# Patient Record
Sex: Female | Born: 1955 | Race: Black or African American | Hispanic: No | Smoking: Never smoker
Health system: Southern US, Community
[De-identification: ages and names within clinical notes are randomized; demographics above are authoritative.]

## PROBLEM LIST (undated history)

## (undated) ENCOUNTER — Ambulatory Visit: Admission: EM | Payer: Medicare HMO

## (undated) DIAGNOSIS — K760 Fatty (change of) liver, not elsewhere classified: Secondary | ICD-10-CM

## (undated) DIAGNOSIS — Z9889 Other specified postprocedural states: Secondary | ICD-10-CM

## (undated) DIAGNOSIS — I10 Essential (primary) hypertension: Secondary | ICD-10-CM

## (undated) DIAGNOSIS — K219 Gastro-esophageal reflux disease without esophagitis: Secondary | ICD-10-CM

## (undated) DIAGNOSIS — R7303 Prediabetes: Secondary | ICD-10-CM

## (undated) DIAGNOSIS — J189 Pneumonia, unspecified organism: Secondary | ICD-10-CM

## (undated) DIAGNOSIS — E785 Hyperlipidemia, unspecified: Secondary | ICD-10-CM

## (undated) HISTORY — DX: Prediabetes: R73.03

## (undated) HISTORY — PX: APPENDECTOMY: SHX54

## (undated) HISTORY — DX: Gastro-esophageal reflux disease without esophagitis: K21.9

## (undated) HISTORY — DX: Fatty (change of) liver, not elsewhere classified: K76.0

## (undated) HISTORY — PX: ABDOMINAL HYSTERECTOMY: SHX81

## (undated) HISTORY — PX: CHOLECYSTECTOMY: SHX55

## (undated) HISTORY — DX: Other specified postprocedural states: Z98.890

## (undated) HISTORY — PX: TEMPORAL ARTERY BIOPSY / LIGATION: SUR132

## (undated) HISTORY — PX: OTHER SURGICAL HISTORY: SHX169

---

## 1999-05-30 DIAGNOSIS — Z9889 Other specified postprocedural states: Secondary | ICD-10-CM

## 1999-05-30 HISTORY — DX: Other specified postprocedural states: Z98.890

## 2005-03-25 ENCOUNTER — Emergency Department (HOSPITAL_COMMUNITY): Admission: EM | Admit: 2005-03-25 | Discharge: 2005-03-25 | Payer: Self-pay | Admitting: Emergency Medicine

## 2006-05-29 DIAGNOSIS — Z9889 Other specified postprocedural states: Secondary | ICD-10-CM

## 2006-05-29 HISTORY — DX: Other specified postprocedural states: Z98.890

## 2007-08-02 ENCOUNTER — Observation Stay (HOSPITAL_COMMUNITY): Admission: EM | Admit: 2007-08-02 | Discharge: 2007-08-03 | Payer: Self-pay | Admitting: Family Medicine

## 2007-08-08 ENCOUNTER — Emergency Department (HOSPITAL_COMMUNITY): Admission: EM | Admit: 2007-08-08 | Discharge: 2007-08-08 | Payer: Self-pay | Admitting: Emergency Medicine

## 2009-07-19 ENCOUNTER — Emergency Department: Payer: Self-pay | Admitting: Emergency Medicine

## 2010-06-19 ENCOUNTER — Encounter: Payer: Self-pay | Admitting: Internal Medicine

## 2010-06-22 ENCOUNTER — Ambulatory Visit (HOSPITAL_COMMUNITY)
Admission: RE | Admit: 2010-06-22 | Discharge: 2010-06-22 | Payer: Self-pay | Source: Home / Self Care | Attending: Family Medicine | Admitting: Family Medicine

## 2010-06-22 ENCOUNTER — Other Ambulatory Visit (HOSPITAL_COMMUNITY): Payer: Self-pay | Admitting: Family Medicine

## 2010-06-22 DIAGNOSIS — Z1239 Encounter for other screening for malignant neoplasm of breast: Secondary | ICD-10-CM

## 2010-06-23 ENCOUNTER — Ambulatory Visit (HOSPITAL_COMMUNITY)
Admission: RE | Admit: 2010-06-23 | Discharge: 2010-06-23 | Payer: Self-pay | Source: Home / Self Care | Attending: Family Medicine | Admitting: Family Medicine

## 2010-07-11 ENCOUNTER — Ambulatory Visit: Payer: Self-pay | Admitting: Gastroenterology

## 2010-07-18 ENCOUNTER — Other Ambulatory Visit (HOSPITAL_COMMUNITY): Payer: Self-pay | Admitting: Family Medicine

## 2010-07-18 DIAGNOSIS — R928 Other abnormal and inconclusive findings on diagnostic imaging of breast: Secondary | ICD-10-CM

## 2010-07-20 ENCOUNTER — Other Ambulatory Visit (HOSPITAL_COMMUNITY): Payer: Self-pay | Admitting: Family Medicine

## 2010-07-20 ENCOUNTER — Ambulatory Visit (HOSPITAL_COMMUNITY)
Admission: RE | Admit: 2010-07-20 | Discharge: 2010-07-20 | Disposition: A | Discharge status: Home / Self Care | Payer: PRIVATE HEALTH INSURANCE | Source: Ambulatory Visit | Attending: Family Medicine | Admitting: Family Medicine

## 2010-07-20 DIAGNOSIS — R928 Other abnormal and inconclusive findings on diagnostic imaging of breast: Secondary | ICD-10-CM | POA: Insufficient documentation

## 2010-07-20 DIAGNOSIS — N63 Unspecified lump in unspecified breast: Secondary | ICD-10-CM

## 2010-07-20 DIAGNOSIS — Z803 Family history of malignant neoplasm of breast: Secondary | ICD-10-CM | POA: Insufficient documentation

## 2010-07-27 ENCOUNTER — Ambulatory Visit (HOSPITAL_COMMUNITY): Payer: PRIVATE HEALTH INSURANCE

## 2010-08-02 ENCOUNTER — Ambulatory Visit: Payer: Self-pay | Admitting: Gastroenterology

## 2010-08-24 ENCOUNTER — Ambulatory Visit (HOSPITAL_COMMUNITY)
Admission: RE | Admit: 2010-08-24 | Discharge: 2010-08-24 | Disposition: A | Discharge status: Home / Self Care | Payer: PRIVATE HEALTH INSURANCE | Source: Ambulatory Visit | Attending: Family Medicine | Admitting: Family Medicine

## 2010-08-24 ENCOUNTER — Other Ambulatory Visit (HOSPITAL_COMMUNITY): Payer: Self-pay | Admitting: Family Medicine

## 2010-08-24 DIAGNOSIS — N63 Unspecified lump in unspecified breast: Secondary | ICD-10-CM

## 2010-08-24 DIAGNOSIS — N6009 Solitary cyst of unspecified breast: Secondary | ICD-10-CM | POA: Insufficient documentation

## 2010-09-05 ENCOUNTER — Ambulatory Visit: Payer: Self-pay | Admitting: Gastroenterology

## 2010-09-08 ENCOUNTER — Ambulatory Visit (INDEPENDENT_AMBULATORY_CARE_PROVIDER_SITE_OTHER): Payer: PRIVATE HEALTH INSURANCE | Admitting: Gastroenterology

## 2010-09-08 ENCOUNTER — Encounter: Payer: Self-pay | Admitting: Gastroenterology

## 2010-09-08 DIAGNOSIS — R109 Unspecified abdominal pain: Secondary | ICD-10-CM

## 2010-09-08 DIAGNOSIS — K921 Melena: Secondary | ICD-10-CM

## 2010-09-08 NOTE — Progress Notes (Signed)
Referring Provider: Dr. Gerda Diss Primary Care Physician:  Harlow Asa, MD Primary Gastroenterologist:  Dr. Jena Gauss  Chief Complaint  Patient presents with  . Pre-op Exam    since Jan lower abd hurts when standing for more than 15-56mins. pt states she gets relief when guarding during pain    HPI:  Meagan Carr is a 55 year old African-American female, referred by Dr. Gerda Diss for an updated colonscopy. Last performed in 2008 in [place] Remote hx of polyps, unsure type.  States having lower abdominal discomfort, can't stand up for more than 15 minutes until it starts hurting. If stands too long, has to double over to ease off pain. Takes ibuprofen intermittently. Described as a "continuous burning", dull, feels like a toothache. 10/10. Had Pap smear about 2 weeks ago. Has about 4-6 BMs, soft stool per day. Abdominal pain started early jan. Prior to this, would have gone perhaps once per day, sometimes several times postprandial. No brbpr. Does report possible melanotic stools.  Occasional reflux, mild. Rare. No dysphagia/odynophagia. Does report getting "strangled on air", gets choked, can't get breath. Strangled on water. Epigastric sensation, feels like something "is stuck". Occasional nausea after eating. Been taking ibuprofen since jan, tries to take sparingly. Afebrile.   Past Medical History  Diagnosis Date  . S/P colonoscopy with polypectomy 2001    [place], polyps, unsure what type  . S/P colonoscopy 2008    Normal    Past Surgical History  Procedure Date  . C-section     X2  . Appendectomy   . Cholecystectomy   . Abdominal hysterectomy     Current Outpatient Prescriptions  Medication Sig Dispense Refill  . ibuprofen (ADVIL,MOTRIN) 200 MG tablet Take 200 mg by mouth as needed.          Allergies as of 09/08/2010  . (No Known Allergies)    Family History  Problem Relation Age of Onset  . Breast cancer Mother     metastasized to bones, deceased age 67  . Brain  cancer Father     deceased age 40  . Colon cancer Neg Hx     History   Social History  . Marital Status: Widowed    Spouse Name: N/A    Number of Children: 2  . Years of Education: N/A   Occupational History  .     Social History Main Topics  . Smoking status: Never Smoker   . Smokeless tobacco: Never Used     Review of Systems: Gen: Denies any fever, chills, sweats, anorexia, fatigue, weakness, malaise, weight loss, and sleep disorder CV: Denies chest pain, angina, palpitations, syncope, orthopnea, PND, peripheral edema, and claudication. Resp: Denies dyspnea at rest, dyspnea with exercise, cough, sputum, wheezing, coughing up blood, and pleurisy. GI: See HPI GU : Denies urinary burning, blood in urine, urinary frequency, urinary hesitancy, nocturnal urination, and urinary incontinence. MS: Denies joint pain, limitation of movement, and swelling, stiffness, low back pain, extremity pain. Denies muscle weakness, cramps, atrophy.  Derm: Denies rash, itching, dry skin, hives, moles, warts, or unhealing ulcers.  Psych: Denies depression, anxiety, memory loss, suicidal ideation, hallucinations, paranoia, and confusion. Heme: Denies bruising, bleeding, and enlarged lymph nodes.  Physical Exam: BP 134/98  Pulse 80  Temp 97.3 F (36.3 C)  Ht 5\' 3"  (1.6 m)  Wt 210 lb 12.8 oz (95.618 kg)  BMI 37.34 kg/m2  SpO2 99% General:   Alert,  Well-developed, well-nourished, pleasant and cooperative in NAD Head:  Normocephalic and atraumatic. Eyes:  Sclera clear, no icterus.   Conjunctiva pink. Ears:  Normal auditory acuity. Nose:  No deformity, discharge,  or lesions. Mouth:  No deformity or lesions, dentition normal. Neck:  Supple; no masses or thyromegaly. Lungs:  Clear throughout to auscultation.   No wheezes, crackles, or rhonchi. No acute distress. Heart:  Regular rate and rhythm; no murmurs, clicks, rubs,  or gallops. Abdomen:  Soft, nondistended. Mildly tender to palpation  lower abdomen, bilaterally. No masses, hepatosplenomegaly or hernias noted. Normal bowel sounds, without guarding, and without rebound.   Rectal:  Deferred until time of colonoscopy.   Msk:  Symmetrical without gross deformities. Normal posture. Pulses:  Normal pulses noted. Extremities:  Without clubbing or edema. Neurologic:  Alert and  oriented x4;  grossly normal neurologically. Skin:  Intact without significant lesions or rashes. Cervical Nodes:  No significant cervical adenopathy. Psych:  Alert and cooperative. Normal mood and affect.

## 2010-09-08 NOTE — Patient Instructions (Signed)
We will retrieve the reports from your prior colonoscopy  We have set you up for a colonoscopy and possible upper endoscopy  Please obtain stool sample and return to office   Further recommendations to follow

## 2010-09-12 ENCOUNTER — Telehealth: Payer: Self-pay | Admitting: General Practice

## 2010-09-12 ENCOUNTER — Ambulatory Visit: Payer: PRIVATE HEALTH INSURANCE | Admitting: Internal Medicine

## 2010-09-12 DIAGNOSIS — D649 Anemia, unspecified: Secondary | ICD-10-CM

## 2010-09-12 LAB — IFOBT (OCCULT BLOOD): IFOBT: NEGATIVE

## 2010-09-12 NOTE — Telephone Encounter (Signed)
I called and cx procedure with Kim in endo. I tried to call pt and mailbox was full.

## 2010-09-13 ENCOUNTER — Encounter: Payer: PRIVATE HEALTH INSURANCE | Admitting: Internal Medicine

## 2010-09-13 ENCOUNTER — Encounter: Payer: Self-pay | Admitting: Gastroenterology

## 2010-09-13 DIAGNOSIS — R109 Unspecified abdominal pain: Secondary | ICD-10-CM | POA: Insufficient documentation

## 2010-09-13 DIAGNOSIS — K921 Melena: Secondary | ICD-10-CM | POA: Insufficient documentation

## 2010-09-13 NOTE — Assessment & Plan Note (Signed)
Lower abdominal pain since January, described as constant, like a toothache, 10/10 at times. No brbpr. No loose stools, yet stool frequency has increased to 4-6 soft BMs per day. Last colonoscopy in 2008 normal. Pt doubled over at times secondary to discomfort; finds it difficult to stand for long periods of time.  Retrieve labs from Dr. Gerda Diss Find out if CT abd/pelvis been performed at outside facility; if not, will proceed with one prior to colonoscopy. Proceed with TCS (+/- EGD) with Dr. Jena Gauss in near future: the risks, benefits, and alternatives have been discussed with the patient in detail. The patient states understanding and desires to proceed.

## 2010-09-13 NOTE — Assessment & Plan Note (Signed)
Several possible episodes of melena in the setting of ibuprofen use, intermittently, since January. No epigastric pain, very mild reflux r/t food choices. Does not take anything over-the-counter due to its rarity. Vague symptoms/sensation of something "stuck" in epigastric region as well as reports of "choking on air", "strangled with water". +nausea after eating occasionally. Differentials of mild gastritis, PUD in the setting of Ibuprofen use since January. Vague dysphagia symptoms. Will check ifobt due to possible reports of melena, set up for possible EGD at time of colonoscopy. Start PPI, avoid Ibuprofen.   Avoid ibuprofen Start Prilosec 20 mg po daily, 30 minutes before breakfast ifobt Possible EGD at time of colonoscopy. The R/B/A have been discussed in detail with pt, and she states understanding.

## 2010-09-14 ENCOUNTER — Encounter: Payer: PRIVATE HEALTH INSURANCE | Admitting: Internal Medicine

## 2010-09-21 ENCOUNTER — Encounter: Payer: Self-pay | Admitting: Gastroenterology

## 2010-09-21 NOTE — Progress Notes (Unsigned)
Our office has attempted to call pt several times in order to set up a CT scan prior to colonoscopy (unable to document as I had sent in a message vs actual progress note).  As pt has not had one yet and none was done at outside facility, let's try to get this done prior to colonoscopy on may 1st if at all possible.  CT abd/pelvis, reason: abdominal pain.

## 2010-09-22 NOTE — Progress Notes (Signed)
Do you want the CT scan with contrast?

## 2010-09-22 NOTE — Progress Notes (Signed)
Do you want the CT with IV./ oral contrast?

## 2010-09-22 NOTE — Progress Notes (Signed)
Yes, with contrast

## 2010-09-23 ENCOUNTER — Other Ambulatory Visit: Payer: Self-pay | Admitting: Internal Medicine

## 2010-09-23 NOTE — Progress Notes (Signed)
UNABLE TO GET THE CT BEFORE HER TCS- I HAVE IT SCHEDULED FOR MAY 3- IS THIS OK?

## 2010-09-26 NOTE — Progress Notes (Signed)
As pt's insurance has a "block", pt is unable to proceed with colonoscopy/egd. When insurance issues are worked out, let's proceed with CT first prior to TCS/EGD.

## 2010-09-26 NOTE — Progress Notes (Signed)
Can we get progress report on pt?

## 2010-09-27 ENCOUNTER — Encounter: Payer: PRIVATE HEALTH INSURANCE | Admitting: Internal Medicine

## 2010-09-27 ENCOUNTER — Ambulatory Visit (HOSPITAL_COMMUNITY)
Admission: RE | Admit: 2010-09-27 | Payer: PRIVATE HEALTH INSURANCE | Source: Ambulatory Visit | Admitting: Internal Medicine

## 2010-09-29 ENCOUNTER — Encounter: Payer: PRIVATE HEALTH INSURANCE | Admitting: Internal Medicine

## 2010-09-29 ENCOUNTER — Ambulatory Visit (HOSPITAL_COMMUNITY): Payer: PRIVATE HEALTH INSURANCE

## 2010-10-11 NOTE — Discharge Summary (Signed)
Meagan Carr, Meagan Carr                 ACCOUNT NO.:  [id number]   MEDICAL RECORD NO.:  [id number]          PATIENT TYPE:  INP   LOCATION:  2010                         FACILITY:  [facility]   PHYSICIAN:  Hillery Aldo, M.D.   DATE OF BIRTH:  1955-10-13   DATE OF ADMISSION:  08/02/2007  DATE OF DISCHARGE:  08/03/2007                               DISCHARGE SUMMARY   PRIORITY DISCHARGE SUMMARY   PRIMARY CARE PHYSICIAN:  Dorothyann Peng, MD.   CARDIOLOGIST:  The patient is being referred to Dr. Jenne Campus for followup  at [facility].   DISCHARGE DIAGNOSES:  1. Atypical chest pain.  2. Abnormal chest x-ray, atelectasis versus airspace disease/early      infection with no leukocytosis, dyspnea, or signs of infection.  3. Obesity.  4. Dyslipidemia.   DISCHARGE MEDICATIONS:  1. Zocor 40 mg daily.  2. Aspirin 81 mg daily.  3. Protonix 40 mg daily.  4. Avelox 400 mg daily x6 more days.  5. Nitroglycerin 0.4 mg sublingually q.5 minutes p.r.n.   CONSULTATIONS:  None.  Telephone consultation made with Dr. Jenne Campus to  set up outpatient stress testing.   BRIEF ADMISSION HPI:  The patient is a 55 year old female with a past  medical history of bronchitis.  She has no other significant past  medical history.  She presented to the hospital with a chief complaint  of chest pain.  Pain initially began in her anterior neck area and  radiated to her jaw.  She also reports pain over the xiphoid process for  many months.  Additionally, she has had some symptoms suggestive of  reflux.  She was admitted for further evaluation and treatment.   PROCEDURES AND DIAGNOSTIC STUDIES:  1. Chest x-ray showed left greater than right airspace disease,      atelectasis vs. early infection.  Probable small left pleural      effusion with no pulmonary vascular congestion.  2. A 12-lead electrocardiogram showed normal sinus rhythm at 66 bpm, Q      wave inversions in V1 and V2 but otherwise normal.   DISCHARGE LABORATORY VALUES:  Cardiac enzymes were cycled q.8h. x3 sets  and were completely negative.  D-dimer was not elevated.  TSH was 2.104.  BNP less than 30.  Sodium was 133, potassium 3.7, chloride 104, bicarb  23, BUN 7, creatinine 0.78, glucose 36.  White blood cell count was 5.6,  hemoglobin 12.6, hematocrit 37.1, platelets 254,000.  Total cholesterol  was 251, triglycerides 191, HDL 37, LDL 176.   HOSPITAL COURSE BY PROBLEM:  1. Atypical chest pain:  Patient's atypical chest pain could have      multiple potential etiologies.  She does describe reflux type      symptoms and there are abnormalities on chest x-ray suggestive of      perhaps an early pneumonia.  Pain over the xiphoid process could      also represent costochondritis.  Nevertheless, the patient's      complaints were very atypical and nonexertional in nature.  She has      no family  history of early coronary disease.  She has no history of      tobacco abuse.  She does have dyslipidemia on risk factor      assessment but, otherwise, no other significant risk factors.  She      is mildly obese.  Given that she has had 3 sets of negative      enzymes, it is felt that the patient could technically be      discharged home with followup stress testing done as an outpatient      on Monday.  The patient agrees to this plan.  Until this issue is      completely elucidated, the patient will be discharged on daily      aspirin and put on some nitroglycerin to take as needed.  2. Abnormal x-ray: Possibly read by radiologist as an early pneumonia.      The patient is afebrile with no elevation of her white blood cell      count.  She is not dyspneic.  She has an occasional nonproductive      dry cough.  Will empirically treat her with a full 1-week course of      Avelox.  She should have a followup chest x-ray done by her primary      care physician as an outpatient.  3. Dyslipidemia:  The patient will have her liver  function studies      checked prior to discharge and be put on statin therapy.  She      should have followup of the lipid panel and liver function studies      checked in 6 weeks by her primary care physician.   DISPOSITION:  The patient is medically stable and will be discharged to  home.  She will follow up with Dr. Jenne Campus on Monday for an outpatient  stress test.  Patient can be reached at cell phone #[phone].      Hillery Aldo, M.D.  Electronically Signed     CR/MEDQ  D:  08/03/2007  T:  08/03/2007  Job:  [id number]   cc:   Candyce Churn. Allyne Gee, M.D.  Darlin Priestly, MD

## 2010-10-11 NOTE — H&P (Signed)
Meagan Carr, Meagan Carr                 ACCOUNT NO.:  [id number]   MEDICAL RECORD NO.:  [id number]          PATIENT TYPE:  INP   LOCATION:  2010                         FACILITY:  [facility]   PHYSICIAN:  Hillery Aldo, M.D.   DATE OF BIRTH:  05-03-1956   DATE OF ADMISSION:  08/02/2007  DATE OF DISCHARGE:  08/03/2007                              HISTORY & PHYSICAL   PRIMARY CARE PHYSICIAN:  Dr. Allyne Gee.   CHIEF COMPLAINT:  Chest pain.   HISTORY OF PRESENT ILLNESS:  The patient is a 55 year old female with no  significant past medical history, who developed a sudden onset of chest  pain while at rest, yesterday.  She describes the pain as being mainly  located in the anterior portion of her neck, radiating to her collar  bones bilaterally.  She was able to go to sleep, and the following  morning, she had pain radiating up into her right jaw and substernal  chest pain.  She took two aspirins and got some relief with this  measure, but because of concerns that she took two aspirin to relieve  her pain, she decided to come to the emergency department for further  evaluation.  The pain was accompanied by mild nausea, lightheadedness,  some shortness of breath, and diaphoresis, versus what she describes as  a hot flash.  There is no vomiting.  Upon initial evaluation in the  emergency department, she had recurring pain and was given a sublingual  nitroglycerin, which seemed to help somewhat.  She is therefore being  admitted to rule out acute coronary syndrome.   PAST MEDICAL HISTORY:  Bronchitis.   PAST SURGICAL HISTORY:  1. Hysterectomy.  2. Cholecystectomy.  3. Appendectomy.   FAMILY HISTORY:  The patient's mother died at 60 from complications of  congestive heart failure related to chemotherapy administration. for  treatment of breast cancer.  The patient's father is alive at 71 and has  a brain tumor.  She has three sisters and two healthy brothers.  She has  two healthy children.   SOCIAL HISTORY:  The patient is widowed and lives alone.  She is a  lifelong nonsmoker.  She denies alcohol or drug use.  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   ALLERGIES:  NONE.   MEDICATIONS:  None, except for over-the-counter aspirin as needed.   REVIEW OF SYSTEMS:  The patient has had some mild headaches.  Denies  diplopia.  She has had some blurry vision.  She has had a nonproductive  cough.  She has significant reflux symptoms 2 days ago, with the  description of an extremely acid water brash-type reflux of fluid up  into the back of her pharynx.  She denies any changes in bowel habits.  No melena or hematochezia.  No dysuria.   PHYSICAL EXAM:  VITAL SIGNS:  Temperature 97.3, pulse 82, respirations  16, blood pressure 123/88, O2 saturation 99% on 2 liters.  GENERAL:  Obese female in no acute distress.  HEENT:  Normocephalic, atraumatic.  PERRL.  EOMI.  Oropharynx is clear.  NECK:  Supple, no thyromegaly, no lymphadenopathy, no jugular venous  distention.  CHEST:  Lungs clear to auscultation bilaterally with good air movement.  HEART:  Regular rate, rhythm.  No murmurs, rubs, or gallops.  ABDOMEN:  Soft, nontender, nondistended.  Normoactive bowel sounds.  EXTREMITIES:  No clubbing, edema, cyanosis.  SKIN:  Warm and dry.  No rashes.  NEUROLOGIC:  The patient is alert, oriented x3.  Cranial nerves II-XII  are grossly intact.  Nonfocal.   DATA:  Reviewed   A 12-lead EKG shows normal sinus rhythm at 66 beats per minute with T-  wave inversions anteriorly in leads V1 and V2.   Chest X-Ray shows left greater than right airspace disease.  Atelectasis  versus early infection.  Probable small left pleural effusion.  Mild  pulmonary vascular congestion.   LABORATORY DATA:  White blood cell count is 5.7, hemoglobin 13.1,  hematocrit 39, platelets 275.  Sodium is 140, potassium 4.1, chloride  107, bicarb 26.5, BUN 7, creatinine 0.9, glucose  87.   ASSESSMENT/PLAN:  1. Chest pain:  This is somewhat atypical, in that it is      nonexertional.  Nevertheless, we will admit the patient to 23-hour      observation telemetry monitoring, to rule out acute coronary      syndrome.  Will cycle cardiac enzymes q.8 h. x3 sets.  Will check a      fasting lipid panel to further risk stratify her.  Given her      abnormalities on chest radiography, we will check a D-dimer and if      elevated proceed with a CT angiogram.  Would also check a brain      natriuretic peptide hormone level and a TSH level.  Put the patient      on daily aspirin, sublingual nitroglycerin p.r.n., and monitor her      closely.  2. Prophylaxis:  Initiate Protonix for gastrointestinal prophylaxis      and Lovenox for deep venous thrombosis prophylaxis.      Hillery Aldo, M.D.  Electronically Signed     CR/MEDQ  D:  08/02/2007  T:  08/04/2007  Job:  [id number]   cc:   Candyce Churn. Allyne Gee, M.D.

## 2011-02-20 LAB — CBC
HCT: 39
HCT: 36.6
Hemoglobin: 13.1
Hemoglobin: 12.8
MCHC: 33.6
MCV: 88.2
MCV: 87.3
Platelets: 254
Platelets: 275
Platelets: 257
RBC: 4.23
RBC: 4.42
RDW: 12.9
WBC: 5.6
WBC: 5.7
WBC: 7.4

## 2011-02-20 LAB — CK TOTAL AND CKMB (NOT AT ARMC)
CK, MB: 0.8
CK, MB: 1.1
Relative Index: 0.9
Total CK: 113
Total CK: 119
Total CK: 124

## 2011-02-20 LAB — DIFFERENTIAL
Eosinophils Absolute: 0.1
Eosinophils Relative: 2
Lymphocytes Relative: 40
Lymphs Abs: 2.9
Monocytes Absolute: 0.6
Monocytes Relative: 8

## 2011-02-20 LAB — HEPATIC FUNCTION PANEL
ALT: 14
AST: 18
Alkaline Phosphatase: 64
Bilirubin, Direct: 0.1
Indirect Bilirubin: 0.5
Total Protein: 6

## 2011-02-20 LAB — POCT CARDIAC MARKERS
CKMB, poc: <1 — ABNORMAL LOW
CKMB, poc: <1 — ABNORMAL LOW
CKMB, poc: <1 — ABNORMAL LOW
Myoglobin, poc: 47.9
Myoglobin, poc: 76.1
Operator id: 285841
Operator id: 288331
Troponin i, poc: <0.05
Troponin i, poc: <0.05
Troponin i, poc: <0.05

## 2011-02-20 LAB — I-STAT 8, (EC8 V) (CONVERTED LAB)
BUN: 7
Bicarbonate: 26.5 — ABNORMAL HIGH
Chloride: 107
Glucose, Bld: 87
HCT: 43
Hemoglobin: 14.6
Operator id: 288331
Potassium: 4.1
Sodium: 140
TCO2: 28
pCO2, Ven: 47
pH, Ven: 7.359 — ABNORMAL HIGH

## 2011-02-20 LAB — POCT I-STAT CREATININE
Creatinine, Ser: 0.9
Operator id: 288331

## 2011-02-20 LAB — BASIC METABOLIC PANEL
BUN: 7
BUN: 8
Calcium: 8.4
Chloride: 108
Creatinine, Ser: 0.78
GFR calc Af Amer: >60
GFR calc non Af Amer: >60
Potassium: 3.9
Sodium: 141

## 2011-02-20 LAB — TSH: TSH: 2.104

## 2011-02-20 LAB — LIPID PANEL
HDL: 37 — ABNORMAL LOW
Total CHOL/HDL Ratio: 6.8
Triglycerides: 191 — ABNORMAL HIGH
VLDL: 38

## 2011-02-20 LAB — TROPONIN I
Troponin I: <0.01
Troponin I: <0.01

## 2011-02-20 LAB — D-DIMER, QUANTITATIVE: D-Dimer, Quant: 0.25

## 2011-02-20 LAB — B-NATRIURETIC PEPTIDE (CONVERTED LAB): Pro B Natriuretic peptide (BNP): <30

## 2012-02-10 ENCOUNTER — Emergency Department (HOSPITAL_COMMUNITY)
Admission: EM | Admit: 2012-02-10 | Discharge: 2012-02-10 | Disposition: A | Discharge status: Home / Self Care | Payer: Self-pay | Attending: Emergency Medicine | Admitting: Emergency Medicine

## 2012-02-10 ENCOUNTER — Encounter (HOSPITAL_COMMUNITY): Payer: Self-pay | Admitting: Emergency Medicine

## 2012-02-10 ENCOUNTER — Emergency Department (HOSPITAL_COMMUNITY): Payer: Self-pay

## 2012-02-10 DIAGNOSIS — Z9089 Acquired absence of other organs: Secondary | ICD-10-CM | POA: Insufficient documentation

## 2012-02-10 DIAGNOSIS — M25539 Pain in unspecified wrist: Secondary | ICD-10-CM | POA: Insufficient documentation

## 2012-02-10 DIAGNOSIS — R209 Unspecified disturbances of skin sensation: Secondary | ICD-10-CM | POA: Insufficient documentation

## 2012-02-10 MED ORDER — NAPROXEN 500 MG PO TABS: 500.0000 mg | ORAL_TABLET | Freq: Two times a day (BID) | ORAL | Status: AC

## 2012-02-10 MED ORDER — LIDOCAINE-EPINEPHRINE-TETRACAINE (LET) SOLUTION
NASAL | Status: AC
  Filled 2012-02-10: qty 3

## 2012-02-10 MED ORDER — KETOROLAC TROMETHAMINE 60 MG/2ML IM SOLN
60.0000 mg | Freq: Once | INTRAMUSCULAR | Status: AC
  Administered 2012-02-10: 60 mg via INTRAMUSCULAR
  Filled 2012-02-10: qty 2

## 2012-02-10 NOTE — ED Notes (Signed)
Patient with no complaints at this time. Respirations even and unlabored. Skin warm/dry. Discharge instructions reviewed with patient at this time. Patient given opportunity to voice concerns/ask questions. Patient discharged at this time and left Emergency Department with steady gait.   

## 2012-02-10 NOTE — ED Notes (Signed)
Pt reports that she hit her right wrist a few weeks ago and it hasn't improved. Pt reports the pain has progressively increased over this time. Pt right hand/wrist has minimal swelling. Pt has two "knots" on the back of her right hand and right lateral side of wrist. Pt is able to move fingers but is unable to bend wrist. Pt reports taking OTC tylenol for pain with minimal relief. No redness is noted.

## 2012-02-11 NOTE — ED Provider Notes (Signed)
History     CSN: [id number]  Arrival date & time 02/10/12  1637   First MD Initiated Contact with Patient 02/10/12 1653      Chief Complaint  Patient presents with  . Wrist Pain    right    (Consider location/radiation/quality/duration/timing/severity/associated sxs/prior treatment) HPI Comments: Meagan Carr presents with complaint of persistent pain with range of motion of her right wrist along with intermittent numbness involving her thumb, index and middle finger which tends to be worse at night or upon first waking and is also worse with palpation along the wrist joint.  Symptoms have been present for the past two weeks, and started shortly after hitting her wrist against her desk edge.  She works in a data entry position.  She has taken ibuprofen without relief of symptoms.    The history is provided by the patient.    Past Medical History  Diagnosis Date  . S/P colonoscopy with polypectomy 2001    [place], polyps, unsure what type  . S/P colonoscopy 2008    Normal    Past Surgical History  Procedure Date  . C-section     X2  . Appendectomy   . Cholecystectomy   . Abdominal hysterectomy     Family History  Problem Relation Age of Onset  . Breast cancer Mother     metastasized to bones, deceased age 41  . Brain cancer Father     deceased age 75  . Colon cancer Neg Hx     History  Substance Use Topics  . Smoking status: Never Smoker   . Smokeless tobacco: Never Used  . Alcohol Use: No    OB History    Grav Para Term Preterm Abortions TAB SAB Ect Mult Living                  Review of Systems  Constitutional: Negative for fever.  HENT: Negative for neck pain.   Respiratory: Negative for shortness of breath.   Musculoskeletal: Positive for arthralgias. Negative for myalgias and joint swelling.  Skin: Negative.  Negative for rash and wound.  Neurological: Positive for numbness. Negative for weakness and headaches.  Hematological: Negative.     Psychiatric/Behavioral: Negative.     Allergies  Review of patient's allergies indicates no known allergies.  Home Medications   Current Outpatient Rx  Name Route Sig Dispense Refill  . IBUPROFEN 200 MG PO TABS Oral Take 200 mg by mouth as needed.      Marland Kitchen NAPROXEN 500 MG PO TABS Oral Take 1 tablet (500 mg total) by mouth 2 (two) times daily. 20 tablet 0    BP 137/94  Pulse 88  Temp 97.8 F (36.6 C) (Oral)  Resp 20  Ht 5\' 3"  (1.6 m)  Wt 200 lb (90.719 kg)  BMI 35.43 kg/m2  SpO2 99%  Physical Exam  Constitutional: She appears well-developed and well-nourished.  HENT:  Head: Atraumatic.  Neck: Normal range of motion.  Cardiovascular:       Pulses equal bilaterally  Musculoskeletal: She exhibits tenderness.       Right wrist: She exhibits decreased range of motion, tenderness and deformity. She exhibits no swelling and no crepitus.       Palpable prominence at dorsal wrist which is non tender.  TTP along volar wrist without edema or deformity.  Distal sensation intact,  Less than 3 sec cap refill.  Radial pulses intact.  Unable to tolerate Phalen or tinel's exam maneuvers secondary to pain.  Neurological: She is alert. She has normal strength. She displays normal reflexes. No sensory deficit.       Equal strength  Skin: Skin is warm and dry.  Psychiatric: She has a normal mood and affect.    ED Course  Procedures (including critical care time)  Labs Reviewed - No data to display Dg Wrist Complete Right  02/10/2012  *RADIOLOGY REPORT*  Clinical Data: Pain  RIGHT WRIST - COMPLETE 3+ VIEW  Comparison: None.  Findings:   There is an accessory ossicle old dorsal to the region of the hamate and capitate.  This can be a cause of pain.  No acute or traumatic finding.  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   Original Report Authenticated By: Thomasenia Sales, M.D.      1. Wrist pain, acute       MDM  Suspect acute  tendonitis vs possible carpal tunnel syndrome.  Pt was encouraged to use wrist splint at night to rest the joint,  Heat therapy,  Naproxen prescribed.  Referral to ortho for recheck if not improving over the next week for recheck.  xrays reviewed and discussed with pt.        Burgess Amor, [place] 02/11/12 1410

## 2012-02-11 NOTE — ED Provider Notes (Signed)
Medical screening examination/treatment/procedure(s) were performed by non-physician practitioner and as supervising physician I was immediately available for consultation/collaboration.  Devoria Albe, MD, Armando Gang   Ward Givens, MD 02/11/12 1501

## 2013-08-17 ENCOUNTER — Emergency Department: Payer: Self-pay | Admitting: Emergency Medicine

## 2013-08-17 LAB — BASIC METABOLIC PANEL
ANION GAP: 3 — AB (ref 7–16)
BUN: 9 mg/dL (ref 7–18)
CHLORIDE: 109 mmol/L — AB (ref 98–107)
CO2: 28 mmol/L (ref 21–32)
Calcium, Total: 8.6 mg/dL (ref 8.5–10.1)
Creatinine: 0.8 mg/dL (ref 0.60–1.30)
EGFR (African American): >60
Glucose: 87 mg/dL (ref 65–99)
OSMOLALITY: 277 (ref 275–301)
POTASSIUM: 4.2 mmol/L (ref 3.5–5.1)
Sodium: 140 mmol/L (ref 136–145)

## 2013-08-17 LAB — CBC
HCT: 38.4 % (ref 35.0–47.0)
HGB: 12.9 g/dL (ref 12.0–16.0)
MCH: 30.1 pg (ref 26.0–34.0)
MCHC: 33.5 g/dL (ref 32.0–36.0)
MCV: 90 fL (ref 80–100)
PLATELETS: 233 10*3/uL (ref 150–440)
RBC: 4.27 10*6/uL (ref 3.80–5.20)
RDW: 13.1 % (ref 11.5–14.5)
WBC: 4.7 10*3/uL (ref 3.6–11.0)

## 2013-08-17 LAB — TROPONIN I: Troponin-I: <0.02 ng/mL

## 2015-03-14 ENCOUNTER — Emergency Department: Payer: PRIVATE HEALTH INSURANCE

## 2015-03-14 ENCOUNTER — Encounter: Payer: Self-pay | Admitting: Emergency Medicine

## 2015-03-14 ENCOUNTER — Emergency Department
Admission: EM | Admit: 2015-03-14 | Discharge: 2015-03-14 | Disposition: A | Discharge status: Home / Self Care | Payer: PRIVATE HEALTH INSURANCE | Attending: Emergency Medicine | Admitting: Emergency Medicine

## 2015-03-14 DIAGNOSIS — J40 Bronchitis, not specified as acute or chronic: Secondary | ICD-10-CM

## 2015-03-14 DIAGNOSIS — J209 Acute bronchitis, unspecified: Secondary | ICD-10-CM | POA: Insufficient documentation

## 2015-03-14 MED ORDER — LIDOCAINE VISCOUS 2 % MT SOLN: 15.0000 mL | Freq: Once | OROMUCOSAL | Status: DC

## 2015-03-14 MED ORDER — TRIAMCINOLONE ACETONIDE 0.1 % MT PSTE: 1.0000 "application " | PASTE | Freq: Two times a day (BID) | OROMUCOSAL | Status: DC

## 2015-03-14 MED ORDER — BENZONATATE 100 MG PO CAPS: 100.0000 mg | ORAL_CAPSULE | Freq: Three times a day (TID) | ORAL | Status: DC | PRN

## 2015-03-14 MED ORDER — IPRATROPIUM-ALBUTEROL 0.5-2.5 (3) MG/3ML IN SOLN
3.0000 mL | Freq: Once | RESPIRATORY_TRACT | Status: AC
  Administered 2015-03-14: 3 mL via RESPIRATORY_TRACT
  Filled 2015-03-14: qty 3

## 2015-03-14 MED ORDER — ALBUTEROL SULFATE HFA 108 (90 BASE) MCG/ACT IN AERS: 2.0000 | INHALATION_SPRAY | Freq: Four times a day (QID) | RESPIRATORY_TRACT | Status: DC | PRN

## 2015-03-14 MED ORDER — AZITHROMYCIN 250 MG PO TABS: ORAL_TABLET | ORAL | Status: DC

## 2015-03-14 NOTE — ED Notes (Signed)
Cough for few days  States cough is prod  states also having some discomfort in chest with cough

## 2015-03-14 NOTE — ED Notes (Signed)
Ambulated to Xray.  No SOB/ DOE.

## 2015-03-14 NOTE — ED Provider Notes (Signed)
[facility] Emergency Department Provider Note ____________________________________________  Time seen: 1250  I have reviewed the triage vital signs and the nursing notes.  HISTORY  Chief Complaint  Cough  HPI Meagan Carr is a 59 y.o. female reports to the ED with complaints ofproductive cough the last few days. Severity is some bilateral symmetric lower chest wall pain secondary to cough. She denies any intermittent fevers, chills, or sweats. She is dosed some over-the-counter cough medicine with intermittent relief. She overall is concern for possible pneumonia due to her yellow colored sputum. She rates her pain and discomfort in the rib cage at a 7 out of 10 in triage.  Past Medical History  Diagnosis Date  . S/P colonoscopy with polypectomy 2001    [place], polyps, unsure what type  . S/P colonoscopy 2008    Normal    Patient Active Problem List   Diagnosis Date Noted  . Abdominal pain 09/13/2010  . Melena 09/13/2010    Past Surgical History  Procedure Laterality Date  . C-section      X2  . Appendectomy    . Cholecystectomy    . Abdominal hysterectomy      Current Outpatient Rx  Name  Route  Sig  Dispense  Refill  . albuterol (PROVENTIL HFA;VENTOLIN HFA) 108 (90 BASE) MCG/ACT inhaler   Inhalation   Inhale 2 puffs into the lungs every 6 (six) hours as needed for wheezing or shortness of breath.   1 Inhaler   0   . azithromycin (ZITHROMAX Z-PAK) 250 MG tablet      Take 2 tablets (500 mg) on  Day 1,  followed by 1 tablet (250 mg) once daily on Days 2 through 5.   6 each   0   . benzonatate (TESSALON PERLES) 100 MG capsule   Oral   Take 1 capsule (100 mg total) by mouth 3 (three) times daily as needed for cough (Take 1-2 per dose).   30 capsule   0   . ibuprofen (ADVIL,MOTRIN) 200 MG tablet   Oral   Take 200 mg by mouth as needed.             Allergies Review of patient's allergies indicates no known allergies.  Family  History  Problem Relation Age of Onset  . Breast cancer Mother     metastasized to bones, deceased age 64  . Brain cancer Father     deceased age 57  . Colon cancer Neg Hx     Social History Social History  Substance Use Topics  . Smoking status: Never Smoker   . Smokeless tobacco: Never Used  . Alcohol Use: No   Review of Systems  Constitutional: Negative for fever. Eyes: Negative for visual changes. ENT: Negative for sore throat. Cardiovascular: Negative for chest pain. Respiratory: Negative for shortness of breath. Reports cough as above. Gastrointestinal: Negative for abdominal pain, vomiting and diarrhea. Genitourinary: Negative for dysuria. Musculoskeletal: Negative for back pain. Rib pain as noted. Skin: Negative for rash. Neurological: Negative for headaches, focal weakness or numbness. ____________________________________________  PHYSICAL EXAM:  VITAL SIGNS: ED Triage Vitals  Enc Vitals Group     BP 03/14/15 1157 142/84 mmHg     Pulse Rate 03/14/15 1157 87     Resp 03/14/15 1157 20     Temp 03/14/15 1157 98.1 F (36.7 C)     Temp Source 03/14/15 1157 Oral     SpO2 03/14/15 1157 98 %     Weight  03/14/15 1157 202 lb (91.627 kg)     Height 03/14/15 1157 5\' 3"  (1.6 m)     Head Cir --      Peak Flow --      Pain Score 03/14/15 1146 6     Pain Loc --      Pain Edu? --      Excl. in GC? --    Constitutional: Alert and oriented. Well appearing and in no distress. Head: Normocephalic and atraumatic.      Eyes: Conjunctivae are normal. PERRL. Normal extraocular movements      Ears: Canals clear. TMs intact bilaterally.   Nose: No congestion/rhinorrhea.   Mouth/Throat: Mucous membranes are moist.   Neck: Supple. No thyromegaly. Hematological/Lymphatic/Immunological: No cervical lymphadenopathy. Cardiovascular: Normal rate, regular rhythm.  Respiratory: Normal respiratory effort. No wheezes/rales/rhonchi. Gastrointestinal: Soft and nontender. No  distention. Musculoskeletal: Nontender with normal range of motion in all extremities.  Neurologic:  Normal gait without ataxia. Normal speech and language. No gross focal neurologic deficits are appreciated. Skin:  Skin is warm, dry and intact. No rash noted. Psychiatric: Mood and affect are normal. Patient exhibits appropriate insight and judgment. ____________________________________________   RADIOLOGY CXR IMPRESSION: No active cardiopulmonary disease.  I, Perry Molla, Charlesetta IvoryJenise V Bacon, personally viewed and evaluated these images (plain radiographs) as part of my medical decision making.  ____________________________________________  PROCEDURES  DuoNeb x 1  ____________________________________________  INITIAL IMPRESSION / ASSESSMENT AND PLAN / ED COURSE  Patient with an acute bronchospasm and upper respiratory infection. She will be discharged with prescriptions for Z-Pak, Tessalon Perles, and an albuterol inhaler. She will follow-up with the primary care provider for ongoing symptoms and return to the ED as needed. ____________________________________________  FINAL CLINICAL IMPRESSION(S) / ED DIAGNOSES  Final diagnoses:  Bronchitis      Lissa HoardJenise V Bacon Heavan Francom, PA-C 03/14/15 1419  Loleta Roseory Forbach, MD 03/14/15 1544

## 2015-03-14 NOTE — Discharge Instructions (Signed)
How to Use an Inhaler Proper inhaler technique is very important. Good technique ensures that the medicine reaches the lungs. Poor technique results in depositing the medicine on the tongue and back of the throat rather than in the airways. If you do not use the inhaler with good technique, the medicine will not help you. STEPS TO FOLLOW IF USING AN INHALER WITHOUT AN EXTENSION TUBE  Remove the cap from the inhaler.  If you are using the inhaler for the first time, you will need to prime it. Shake the inhaler for 5 seconds and release four puffs into the air, away from your face. Ask your health care provider or pharmacist if you have questions about priming your inhaler.  Shake the inhaler for 5 seconds before each breath in (inhalation).  Position the inhaler so that the top of the canister faces up.  Put your index finger on the top of the medicine canister. Your thumb supports the bottom of the inhaler.  Open your mouth.  Either place the inhaler between your teeth and place your lips tightly around the mouthpiece, or hold the inhaler 1-2 inches away from your open mouth. If you are unsure of which technique to use, ask your health care provider.  Breathe out (exhale) normally and as completely as possible.  Press the canister down with your index finger to release the medicine.  At the same time as the canister is pressed, inhale deeply and slowly until your lungs are completely filled. This should take 4-6 seconds. Keep your tongue down.  Hold the medicine in your lungs for 5-10 seconds (10 seconds is best). This helps the medicine get into the small airways of your lungs.  Breathe out slowly, through pursed lips. Whistling is an example of pursed lips.  Wait at least 15-30 seconds between puffs. Continue with the above steps until you have taken the number of puffs your health care provider has ordered. Do not use the inhaler more than your health care provider tells  you.  Replace the cap on the inhaler.  Follow the directions from your health care provider or the inhaler insert for cleaning the inhaler. STEPS TO FOLLOW IF USING AN INHALER WITH AN EXTENSION (SPACER)  Remove the cap from the inhaler.  If you are using the inhaler for the first time, you will need to prime it. Shake the inhaler for 5 seconds and release four puffs into the air, away from your face. Ask your health care provider or pharmacist if you have questions about priming your inhaler.  Shake the inhaler for 5 seconds before each breath in (inhalation).  Place the open end of the spacer onto the mouthpiece of the inhaler.  Position the inhaler so that the top of the canister faces up and the spacer mouthpiece faces you.  Put your index finger on the top of the medicine canister. Your thumb supports the bottom of the inhaler and the spacer.  Breathe out (exhale) normally and as completely as possible.  Immediately after exhaling, place the spacer between your teeth and into your mouth. Close your lips tightly around the spacer.  Press the canister down with your index finger to release the medicine.  At the same time as the canister is pressed, inhale deeply and slowly until your lungs are completely filled. This should take 4-6 seconds. Keep your tongue down and out of the way.  Hold the medicine in your lungs for 5-10 seconds (10 seconds is best). This helps the  medicine get into the small airways of your lungs. Exhale. °· Repeat inhaling deeply through the spacer mouthpiece. Again hold that breath for up to 10 seconds (10 seconds is best). Exhale slowly. If it is difficult to take this second deep breath through the spacer, breathe normally several times through the spacer. Remove the spacer from your mouth. °· Wait at least 15-30 seconds between puffs. Continue with the above steps until you have taken the number of puffs your health care provider has ordered. Do not use the  inhaler more than your health care provider tells you. °· Remove the spacer from the inhaler, and place the cap on the inhaler. °· Follow the directions from your health care provider or the inhaler insert for cleaning the inhaler and spacer. °If you are using different kinds of inhalers, use your quick relief medicine to open the airways 10-15 minutes before using a steroid if instructed to do so by your health care provider. If you are unsure which inhalers to use and the order of using them, ask your health care provider, nurse, or respiratory therapist. °If you are using a steroid inhaler, always rinse your mouth with water after your last puff, then gargle and spit out the water. Do not swallow the water. °AVOID: °· Inhaling before or after starting the spray of medicine. It takes practice to coordinate your breathing with triggering the spray. °· Inhaling through the nose (rather than the mouth) when triggering the spray. °HOW TO DETERMINE IF YOUR INHALER IS FULL OR NEARLY EMPTY °You cannot know when an inhaler is empty by shaking it. A few inhalers are now being made with dose counters. Ask your health care provider for a prescription that has a dose counter if you feel you need that extra help. If your inhaler does not have a counter, ask your health care provider to help you determine the date you need to refill your inhaler. Write the refill date on a calendar or your inhaler canister. Refill your inhaler 7-10 days before it runs out. Be sure to keep an adequate supply of medicine. This includes making sure it is not expired, and that you have a spare inhaler.  °SEEK MEDICAL CARE IF:  °· Your symptoms are only partially relieved with your inhaler. °· You are having trouble using your inhaler. °· You have some increase in phlegm. °SEEK IMMEDIATE MEDICAL CARE IF:  °· You feel little or no relief with your inhalers. You are still wheezing and are feeling shortness of breath or tightness in your chest or  both. °· You have dizziness, headaches, or a fast heart rate. °· You have chills, fever, or night sweats. °· You have a noticeable increase in phlegm production, or there is blood in the phlegm. °MAKE SURE YOU:  °· Understand these instructions. °· Will watch your condition. °· Will get help right away if you are not doing well or get worse. °  °This information is not intended to replace advice given to you by your health care provider. Make sure you discuss any questions you have with your health care provider. °  °Document Released: 05/12/2000 Document Revised: 03/05/2013 Document Reviewed: 12/12/2012 °Elsevier Interactive Patient Education ©2016 [employer] ° °Upper Respiratory Infection, Adult °Most upper respiratory infections (URIs) are caused by a virus. A URI affects the nose, throat, and upper air passages. The most common type of URI is often called "the common cold." °HOME CARE  °· Take medicines only as told by your doctor. °·   Gargle warm saltwater or take cough drops to comfort your throat as told by your doctor.  Use a warm mist humidifier or inhale steam from a shower to increase air moisture. This may make it easier to breathe.  Drink enough fluid to keep your pee (urine) clear or pale yellow.  Eat soups and other clear broths.  Have a healthy diet.  Rest as needed.  Go back to work when your fever is gone or your doctor says it is okay.  You may need to stay home longer to avoid giving your URI to others.  You can also wear a face mask and wash your hands often to prevent spread of the virus.  Use your inhaler more if you have asthma.  Do not use any tobacco products, including cigarettes, chewing tobacco, or electronic cigarettes. If you need help quitting, ask your doctor. GET HELP IF:  You are getting worse, not better.  Your symptoms are not helped by medicine.  You have chills.  You are getting more short of breath.  You have brown or red mucus.  You have  yellow or brown discharge from your nose.  You have pain in your face, especially when you bend forward.  You have a fever.  You have puffy (swollen) neck glands.  You have pain while swallowing.  You have white areas in the back of your throat. GET HELP RIGHT AWAY IF:   You have very bad or constant:  Headache.  Ear pain.  Pain in your forehead, behind your eyes, and over your cheekbones (sinus pain).  Chest pain.  You have long-lasting (chronic) lung disease and any of the following:  Wheezing.  Long-lasting cough.  Coughing up blood.  A change in your usual mucus.  You have a stiff neck.  You have changes in your:  Vision.  Hearing.  Thinking.  Mood. MAKE SURE YOU:   Understand these instructions.  Will watch your condition.  Will get help right away if you are not doing well or get worse.   This information is not intended to replace advice given to you by your health care provider. Make sure you discuss any questions you have with your health care provider.   Document Released: 11/01/2007 Document Revised: 09/29/2014 Document Reviewed: 08/20/2013 Elsevier Interactive Patient Education 2016 [employer]  Dose the prescription meds as directed.  [facility] as needed for continued symptoms.

## 2015-07-02 ENCOUNTER — Emergency Department: Payer: BLUE CROSS/BLUE SHIELD

## 2015-07-02 ENCOUNTER — Encounter: Payer: Self-pay | Admitting: Emergency Medicine

## 2015-07-02 ENCOUNTER — Emergency Department
Admission: EM | Admit: 2015-07-02 | Discharge: 2015-07-03 | Disposition: A | Discharge status: Home / Self Care | Payer: BLUE CROSS/BLUE SHIELD | Attending: Emergency Medicine | Admitting: Emergency Medicine

## 2015-07-02 DIAGNOSIS — M79602 Pain in left arm: Secondary | ICD-10-CM | POA: Diagnosis not present

## 2015-07-02 LAB — CBC WITH DIFFERENTIAL/PLATELET
Basophils Absolute: 0.1 10*3/uL (ref 0–0.1)
Basophils Relative: 1 %
EOS ABS: 0.2 10*3/uL (ref 0–0.7)
EOS PCT: 4 %
HCT: 37.7 % (ref 35.0–47.0)
Hemoglobin: 12.4 g/dL (ref 12.0–16.0)
LYMPHS ABS: 2.6 10*3/uL (ref 1.0–3.6)
Lymphocytes Relative: 42 %
MCH: 28.6 pg (ref 26.0–34.0)
MCHC: 33 g/dL (ref 32.0–36.0)
MCV: 86.7 fL (ref 80.0–100.0)
Monocytes Absolute: 0.5 10*3/uL (ref 0.2–0.9)
Monocytes Relative: 7 %
Neutro Abs: 2.8 10*3/uL (ref 1.4–6.5)
Neutrophils Relative %: 46 %
PLATELETS: 235 10*3/uL (ref 150–440)
RBC: 4.35 MIL/uL (ref 3.80–5.20)
RDW: 13 % (ref 11.5–14.5)
WBC: 6.2 10*3/uL (ref 3.6–11.0)

## 2015-07-02 MED ORDER — ASPIRIN 81 MG PO CHEW
324.0000 mg | CHEWABLE_TABLET | Freq: Once | ORAL | Status: AC
  Administered 2015-07-02: 324 mg via ORAL
  Filled 2015-07-02: qty 4

## 2015-07-02 MED ORDER — ACETAMINOPHEN 500 MG PO TABS
1000.0000 mg | ORAL_TABLET | ORAL | Status: AC
  Administered 2015-07-02: 1000 mg via ORAL
  Filled 2015-07-02: qty 2

## 2015-07-02 NOTE — ED Notes (Signed)
Pt ambulatory to triage C/O pain in left arm from a "knot" pt noticed the "knot" two days ago and received a massage yesterday with temporary relief, pt states members of family have clot hx but pt denies hx of clots.  Pt states dx of HTN but doesn't take meds for HTN.  Pt reports pain left chest and left jaw yesterday, denies N/V/sweats or SOB.    Pt appears relaxed and pleasant.  Strength and skin temp equal bilatterally.  Denies cardiac hx

## 2015-07-02 NOTE — ED Provider Notes (Signed)
[facility] Emergency Department Provider Note  ____________________________________________  Time seen: Approximately 10:18 PM  I have reviewed the triage vital signs and the nursing notes.   HISTORY  Chief Complaint Arm Injury and Extremity Pain    HPI Meagan Carr is a 60 y.o. female denies significant medical history. She presents today having pain in her left arm with some slight swelling that she's noticed for the last 3 days. She reports she had a massage a few days ago spelled same time the pain and discomfort started. She denies any fevers chills or chest pain. Occasionally the pain is sharp and radiates up the entire length of the left arm. No numbness tingling or weakness in the hand. No cold or blue hand.  Patient does have a history of blood clots in her uncle, she is concerned about possible blood clot.   Past Medical History  Diagnosis Date  . S/P colonoscopy with polypectomy 2001    [place], polyps, unsure what type  . S/P colonoscopy 2008    Normal    Patient Active Problem List   Diagnosis Date Noted  . Abdominal pain 09/13/2010  . Melena 09/13/2010    Past Surgical History  Procedure Laterality Date  . C-section      X2  . Appendectomy    . Cholecystectomy    . Abdominal hysterectomy      Current Outpatient Rx  Name  Route  Sig  Dispense  Refill  . ibuprofen (ADVIL,MOTRIN) 200 MG tablet   Oral   Take 200 mg by mouth as needed.           Marland Kitchen albuterol (PROVENTIL HFA;VENTOLIN HFA) 108 (90 BASE) MCG/ACT inhaler   Inhalation   Inhale 2 puffs into the lungs every 6 (six) hours as needed for wheezing or shortness of breath. Patient not taking: Reported on 07/03/2015   1 Inhaler   0   . azithromycin (ZITHROMAX Z-PAK) 250 MG tablet      Take 2 tablets (500 mg) on  Day 1,  followed by 1 tablet (250 mg) once daily on Days 2 through 5. Patient not taking: Reported on 07/03/2015   6 each   0   . benzonatate (TESSALON PERLES)  100 MG capsule   Oral   Take 1 capsule (100 mg total) by mouth 3 (three) times daily as needed for cough (Take 1-2 per dose). Patient not taking: Reported on 07/03/2015   30 capsule   0     Allergies Review of patient's allergies indicates no known allergies.  Family History  Problem Relation Age of Onset  . Breast cancer Mother     metastasized to bones, deceased age 72  . Brain cancer Father     deceased age 5  . Colon cancer Neg Hx     Social History Social History  Substance Use Topics  . Smoking status: Never Smoker   . Smokeless tobacco: Never Used  . Alcohol Use: No    Review of Systems Constitutional: No fever/chills Eyes: No visual changes. ENT: No sore throat. Cardiovascular: Denies chest pain. Respiratory: Denies shortness of breath. Gastrointestinal: No abdominal pain.  No nausea, no vomiting.  No diarrhea.  No constipation. Genitourinary: Negative for dysuria. Musculoskeletal: Negative for back pain. Skin: Negative for rash. Neurological: Negative for headaches, focal weakness or numbness.  10-point ROS otherwise negative.  ____________________________________________   PHYSICAL EXAM:  VITAL SIGNS: ED Triage Vitals  Enc Vitals Group     BP 07/02/15  2103 149/86 mmHg     Pulse Rate 07/02/15 2103 85     Resp 07/02/15 2103 18     Temp 07/02/15 2103 97.6 F (36.4 C)     Temp Source 07/02/15 2103 Oral     SpO2 07/02/15 2103 97 %     Weight 07/02/15 2103 215 lb (97.523 kg)     Height 07/02/15 2103  (1.6 m)     Head Cir --      Peak Flow --      Pain Score 07/02/15 2104 10     Pain Loc --      Pain Edu? --      Excl. in GC? --    Constitutional: Alert and oriented. Well appearing and in no acute distress. Very amicable. Eyes: Conjunctivae are normal. PERRL. EOMI. Head: Atraumatic. Nose: No congestion/rhinnorhea. Mouth/Throat: Mucous membranes are moist.  Oropharynx non-erythematous. Neck: No stridor.  No cervical  tenderness. Cardiovascular: Normal rate, regular rhythm. Grossly normal heart sounds.  Good peripheral circulation. Respiratory: Normal respiratory effort.  No retractions. Lungs CTAB. Gastrointestinal: Soft and nontender. No distention. No abdominal bruits. No CVA tenderness. Musculoskeletal:   Right hand Median, ulnar, radial motor intact. Cap refill less than 2 seconds all digits. Strong radial pulse. 5 out of 5 strength throughout the hand intrinsics, flexion and extension at the wrist. No evidence of trauma.  Left hand Median, ulnar, radial motor intact. Cap refill less than 2 seconds all digits. Strong radial pulse. 5 out of 5 strength throughout the hand intrinsics, flexion and extension at the wrist. No evidence of trauma. No pain or discomfort to palpation over the left shoulder or posterior elbow. Over the antecubital fossa there is a small area of slight edema and slight discomfort size of 2cm ovoid. There is no overlying skin change or erythema.  ____________________________________________   No lower extremity tenderness nor edema.  No joint effusions. Neurologic:  Normal speech and language. No gross focal neurologic deficits are appreciated. Skin:  Skin is warm, dry and intact. No rash noted. Psychiatric: Mood and affect are normal. Speech and behavior are normal.  ____________________________________________   LABS (all labs ordered are listed, but only abnormal results are displayed)  Labs Reviewed  CBC WITH DIFFERENTIAL/PLATELET  CBC WITH DIFFERENTIAL/PLATELET  COMPREHENSIVE METABOLIC PANEL  TROPONIN I   ____________________________________________  EKG  Reviewed and arrhythmia 2110 Normal sinus rhythm, no evidence of acute ischemic abnormality. QRS 80 QTc 420 154 Ventricular rate 80 ____________________________________________  RADIOLOGY     US Venous Img Upper Uni Left (Final result) Result time: 07/02/15 23:56:56   Final result by Rad Results In  Interface (07/02/15 23:56:56)   Narrative:   CLINICAL DATA: Patient with palpable abnormality on the left arm for 2 days. Mild left arm swelling.  EXAM: LEFT UPPER EXTREMITY VENOUS DOPPLER ULTRASOUND  TECHNIQUE: Gray-scale sonography with graded compression, as well as color Doppler and duplex ultrasound were performed to evaluate the upper extremity deep venous system from the level of the subclavian vein and including the jugular, axillary, basilic, radial, ulnar and upper cephalic vein. Spectral Doppler was utilized to evaluate flow at rest and with distal augmentation maneuvers.  COMPARISON: None.  FINDINGS: Contralateral Subclavian Vein: Respiratory phasicity is normal and symmetric with the symptomatic side. No evidence of thrombus. Normal compressibility.  Internal Jugular Vein: No evidence of thrombus. Normal compressibility, respiratory phasicity and response to augmentation.  Subclavian Vein: No evidence of thrombus. Normal compressibility, respiratory phasicity and response to augmentation.  Axillary Vein: No evidence of thrombus. Normal compressibility, respiratory phasicity and response to augmentation.  Cephalic Vein: No evidence of thrombus. Normal compressibility, respiratory phasicity and response to augmentation.  Basilic Vein: No evidence of thrombus. Normal compressibility, respiratory phasicity and response to augmentation.  Brachial Veins: No evidence of thrombus. Normal compressibility, respiratory phasicity and response to augmentation.  Radial Veins: No evidence of thrombus. Normal compressibility, respiratory phasicity and response to augmentation.  Ulnar Veins: No evidence of thrombus. Normal compressibility, respiratory phasicity and response to augmentation.  Venous Reflux: None visualized.  Other Findings: At the patient reported site of palpable abnormality within the antecubital fossa there is a 2.6 cm hypoechoic  mass.  IMPRESSION: No evidence of deep venous thrombosis.  Nonspecific hypoechoic mass within the left antecubital fossa at the patient reported site of palpable abnormality. Recommend clinical correlation as well as correlation with MRI for definitive characterization/evaluation.   Electronically Signed By: Annia Belt M.D. On: 07/02/2015 23:56          DG Chest 2 View (Final result) Result time: 07/02/15 16:10:96   Final result by Rad Results In Interface (07/02/15 21:28:02)   Narrative:   CLINICAL DATA: LEFT chest and jaw pain beginning yesterday.  EXAM: CHEST 2 VIEW  COMPARISON: Chest radiograph March 14, 2015  FINDINGS: The cardiac silhouette is upper limits of normal in size, mediastinal silhouette is unremarkable for this low inspiratory examination with crowded vascular markings. Tiny probable granuloma RIGHT lung base, unchanged. Strandy densities LEFT lung base without pleural effusion or focal consolidation. No pneumothorax. Soft tissue planes included osseous structures are nonsuspicious. Surgical clips in the included right abdomen compatible with cholecystectomy.  IMPRESSION: Borderline cardiomegaly in this low inspiratory examination. LEFT lung base atelectasis.   Electronically Signed By: Awilda Metro M.D. On: 07/02/2015 21:28       ____________________________________________   PROCEDURES  Procedure(s) performed: None  Critical Care performed: No  ____________________________________________   INITIAL IMPRESSION / ASSESSMENT AND PLAN / ED COURSE  Pertinent labs & imaging results that were available during my care of the patient were reviewed by me and considered in my medical decision making (see chart for details).  Patient transfer left arm pain and slight area of induration over the left antecubital space. Question etiology, no evidence of acute neurovascular abnormality. EKG reassuring without chest pain.  Pain is radiating worse with movement, and I question if there could be an area of swelling and approximately the biceps insertion. She able to move and range arm well, and the etiology is slightly clear to me. There is no evidence of fever or infection by clinical exam. Does not appear to represent an abscess, but I would question possibly a cystic formation.  Ultrasound does not demonstrate DVT.  Discussed with the patient, she will follow up closely with her doctor in orthopedics for further evaluation. Ongoing care and disposition assigned to Dr. Zenda Alpers, plan to follow up on troponin and x-ray of the left elbow. Likely discharge to home if labs and xray reassuring ____________________________________________   FINAL CLINICAL IMPRESSION(S) / ED DIAGNOSES  Final diagnoses:  Left arm pain      Sharyn Creamer, MD 07/03/15 [phone]

## 2015-07-02 NOTE — ED Notes (Signed)
Pt has family history of blood clots (uncle had clot in arm). Pt denies any personal hx of clotting disorders. Pt reports 9 out of 10 pain in left arm, radiating to shoulder and jaw. Pt reports movement aggravates pain. Pt exhibiting mild swelling of left arm. Left bicep feels tight to palpation. No redness or warmth noted.

## 2015-07-03 ENCOUNTER — Emergency Department: Payer: BLUE CROSS/BLUE SHIELD

## 2015-07-03 LAB — COMPREHENSIVE METABOLIC PANEL
ALK PHOS: 74 U/L (ref 38–126)
ALT: 22 U/L (ref 14–54)
ANION GAP: 9 (ref 5–15)
AST: 18 U/L (ref 15–41)
Albumin: 3.6 g/dL (ref 3.5–5.0)
BUN: 15 mg/dL (ref 6–20)
CALCIUM: 9.2 mg/dL (ref 8.9–10.3)
CHLORIDE: 106 mmol/L (ref 101–111)
CO2: 26 mmol/L (ref 22–32)
CREATININE: 0.67 mg/dL (ref 0.44–1.00)
Glucose, Bld: 122 mg/dL — ABNORMAL HIGH (ref 65–99)
Potassium: 3.6 mmol/L (ref 3.5–5.1)
SODIUM: 141 mmol/L (ref 135–145)
Total Bilirubin: <0.1 mg/dL — ABNORMAL LOW (ref 0.3–1.2)
Total Protein: 6.9 g/dL (ref 6.5–8.1)

## 2015-07-03 LAB — TROPONIN I

## 2015-07-03 MED ORDER — TRAMADOL HCL 50 MG PO TABS: 50.0000 mg | ORAL_TABLET | Freq: Four times a day (QID) | ORAL | Status: DC | PRN

## 2015-07-03 NOTE — ED Notes (Signed)
Patient discharged to home per MD order. Patient in stable condition, and deemed medically cleared by ED provider for discharge. Discharge instructions reviewed with patient/family using "Teach Back"; verbalized understanding of medication education and administration, and information about follow-up care. Denies further concerns. ° °

## 2015-07-03 NOTE — Discharge Instructions (Signed)
Please follow up closely with your doctor in orthopedics.   Return to the emergency room right away if you develop redness of the skin, fever, numbness, tingling, or difficulty using the left arm or hand. Return right away also if you develop severe chest pain or trouble breathing, or other new concerns or symptoms arise. Musculoskeletal Pain Musculoskeletal pain is muscle and boney aches and pains. These pains can occur in any part of the body. Your caregiver may treat you without knowing the cause of the pain. They may treat you if blood or urine tests, X-rays, and other tests were normal.  CAUSES There is often not a definite cause or reason for these pains. These pains may be caused by a type of germ (virus). The discomfort may also come from overuse. Overuse includes working out too hard when your body is not fit. Boney aches also come from weather changes. Bone is sensitive to atmospheric pressure changes. HOME CARE INSTRUCTIONS   Ask when your test results will be ready. Make sure you get your test results.  Only take over-the-counter or prescription medicines for pain, discomfort, or fever as directed by your caregiver. If you were given medications for your condition, do not drive, operate machinery or power tools, or sign legal documents for 24 hours. Do not drink alcohol. Do not take sleeping pills or other medications that may interfere with treatment.  Continue all activities unless the activities cause more pain. When the pain lessens, slowly resume normal activities. Gradually increase the intensity and duration of the activities or exercise.  During periods of severe pain, bed rest may be helpful. Lay or sit in any position that is comfortable.  Putting ice on the injured area.  Put ice in a bag.  Place a towel between your skin and the bag.  Leave the ice on for 15 to 20 minutes, 3 to 4 times a day.  Follow up with your caregiver for continued problems and no reason can be  found for the pain. If the pain becomes worse or does not go away, it may be necessary to repeat tests or do additional testing. Your caregiver may need to look further for a possible cause. SEEK IMMEDIATE MEDICAL CARE IF:  You have pain that is getting worse and is not relieved by medications.  You develop chest pain that is associated with shortness or breath, sweating, feeling sick to your stomach (nauseous), or throw up (vomit).  Your pain becomes localized to the abdomen.  You develop any new symptoms that seem different or that concern you. MAKE SURE YOU:   Understand these instructions.  Will watch your condition.  Will get help right away if you are not doing well or get worse.   This information is not intended to replace advice given to you by your health care provider. Make sure you discuss any questions you have with your health care provider.   Document Released: 05/15/2005 Document Revised: 08/07/2011 Document Reviewed: 01/17/2013 Elsevier Interactive Patient Education [employer]

## 2015-07-03 NOTE — ED Provider Notes (Signed)
-----------------------------------------   3:13 AM on 07/03/2015 -----------------------------------------   Blood pressure 133/87, pulse 72, temperature 97.6 F (36.4 C), temperature source Oral, resp. rate 16, height  (1.6 m), weight 215 lb (97.523 kg), SpO2 97 %.  Assuming care from Dr. Fanny Bien.  In short, Meagan Carr is a 60 y.o. female with a chief complaint of Arm Injury and Extremity Pain .  Refer to the original H&P for additional details.  The current plan of care is to follow up the results of the blood work and the xray.   DG xray: Tiny coronoid spur. Otherwise unremarkable exam  The patient's blood work is unremarkable. The patient will be discharged to home to follow up with orthopedic surgery.   Rebecka Apley, MD 07/03/15 [phone]

## 2015-07-16 ENCOUNTER — Other Ambulatory Visit: Payer: Self-pay | Admitting: Orthopedic Surgery

## 2015-07-16 DIAGNOSIS — M7989 Other specified soft tissue disorders: Secondary | ICD-10-CM

## 2015-08-03 ENCOUNTER — Ambulatory Visit
Admission: RE | Admit: 2015-08-03 | Discharge: 2015-08-03 | Disposition: A | Discharge status: Home / Self Care | Payer: BLUE CROSS/BLUE SHIELD | Source: Ambulatory Visit | Attending: Orthopedic Surgery | Admitting: Orthopedic Surgery

## 2015-08-03 DIAGNOSIS — M799 Soft tissue disorder, unspecified: Secondary | ICD-10-CM | POA: Diagnosis present

## 2015-08-03 DIAGNOSIS — M25822 Other specified joint disorders, left elbow: Secondary | ICD-10-CM | POA: Diagnosis not present

## 2015-08-03 DIAGNOSIS — R229 Localized swelling, mass and lump, unspecified: Secondary | ICD-10-CM | POA: Diagnosis not present

## 2015-08-03 DIAGNOSIS — M7989 Other specified soft tissue disorders: Secondary | ICD-10-CM

## 2015-08-03 MED ORDER — GADOBENATE DIMEGLUMINE 529 MG/ML IV SOLN
20.0000 mL | Freq: Once | INTRAVENOUS | Status: AC | PRN
  Administered 2015-08-03: 20 mL via INTRAVENOUS

## 2015-08-06 DIAGNOSIS — G5601 Carpal tunnel syndrome, right upper limb: Secondary | ICD-10-CM | POA: Insufficient documentation

## 2015-12-10 ENCOUNTER — Emergency Department: Payer: BLUE CROSS/BLUE SHIELD

## 2015-12-10 ENCOUNTER — Observation Stay
Admission: EM | Admit: 2015-12-10 | Discharge: 2015-12-11 | Disposition: A | Discharge status: Home / Self Care | Payer: BLUE CROSS/BLUE SHIELD | Attending: Internal Medicine | Admitting: Internal Medicine

## 2015-12-10 DIAGNOSIS — Z9049 Acquired absence of other specified parts of digestive tract: Secondary | ICD-10-CM | POA: Diagnosis not present

## 2015-12-10 DIAGNOSIS — R29898 Other symptoms and signs involving the musculoskeletal system: Secondary | ICD-10-CM

## 2015-12-10 DIAGNOSIS — E669 Obesity, unspecified: Secondary | ICD-10-CM | POA: Insufficient documentation

## 2015-12-10 DIAGNOSIS — I491 Atrial premature depolarization: Secondary | ICD-10-CM | POA: Insufficient documentation

## 2015-12-10 DIAGNOSIS — E876 Hypokalemia: Secondary | ICD-10-CM | POA: Diagnosis present

## 2015-12-10 DIAGNOSIS — Z6838 Body mass index (BMI) 38.0-38.9, adult: Secondary | ICD-10-CM | POA: Insufficient documentation

## 2015-12-10 DIAGNOSIS — L299 Pruritus, unspecified: Secondary | ICD-10-CM | POA: Insufficient documentation

## 2015-12-10 DIAGNOSIS — Z90710 Acquired absence of both cervix and uterus: Secondary | ICD-10-CM | POA: Insufficient documentation

## 2015-12-10 DIAGNOSIS — I639 Cerebral infarction, unspecified: Secondary | ICD-10-CM

## 2015-12-10 DIAGNOSIS — Z7982 Long term (current) use of aspirin: Secondary | ICD-10-CM | POA: Insufficient documentation

## 2015-12-10 DIAGNOSIS — I10 Essential (primary) hypertension: Secondary | ICD-10-CM | POA: Diagnosis not present

## 2015-12-10 DIAGNOSIS — R202 Paresthesia of skin: Secondary | ICD-10-CM

## 2015-12-10 DIAGNOSIS — Z86010 Personal history of colonic polyps: Secondary | ICD-10-CM | POA: Insufficient documentation

## 2015-12-10 DIAGNOSIS — G459 Transient cerebral ischemic attack, unspecified: Secondary | ICD-10-CM | POA: Diagnosis not present

## 2015-12-10 DIAGNOSIS — R200 Anesthesia of skin: Secondary | ICD-10-CM

## 2015-12-10 HISTORY — DX: Essential (primary) hypertension: I10

## 2015-12-10 LAB — COMPREHENSIVE METABOLIC PANEL
ALBUMIN: 3.7 g/dL (ref 3.5–5.0)
ALT: 21 U/L (ref 14–54)
ANION GAP: 7 (ref 5–15)
AST: 21 U/L (ref 15–41)
Alkaline Phosphatase: 85 U/L (ref 38–126)
BILIRUBIN TOTAL: 0.1 mg/dL — AB (ref 0.3–1.2)
BUN: 13 mg/dL (ref 6–20)
CO2: 29 mmol/L (ref 22–32)
Calcium: 9.3 mg/dL (ref 8.9–10.3)
Chloride: 105 mmol/L (ref 101–111)
Creatinine, Ser: 0.77 mg/dL (ref 0.44–1.00)
GFR calc non Af Amer: >60 mL/min (ref >60)
GLUCOSE: 114 mg/dL — AB (ref 65–99)
POTASSIUM: 3.2 mmol/L — AB (ref 3.5–5.1)
SODIUM: 141 mmol/L (ref 135–145)
TOTAL PROTEIN: 7.1 g/dL (ref 6.5–8.1)

## 2015-12-10 LAB — CBC
HCT: 35.8 % (ref 35.0–47.0)
Hemoglobin: 12.2 g/dL (ref 12.0–16.0)
MCH: 29.7 pg (ref 26.0–34.0)
MCHC: 34.2 g/dL (ref 32.0–36.0)
MCV: 86.7 fL (ref 80.0–100.0)
PLATELETS: 246 10*3/uL (ref 150–440)
RBC: 4.13 MIL/uL (ref 3.80–5.20)
RDW: 13.2 % (ref 11.5–14.5)
WBC: 8 10*3/uL (ref 3.6–11.0)

## 2015-12-10 LAB — DIFFERENTIAL
Basophils Absolute: 0.1 10*3/uL (ref 0–0.1)
Basophils Relative: 1 %
EOS ABS: 0.2 10*3/uL (ref 0–0.7)
EOS PCT: 2 %
LYMPHS ABS: 3 10*3/uL (ref 1.0–3.6)
Lymphocytes Relative: 37 %
MONO ABS: 0.6 10*3/uL (ref 0.2–0.9)
Monocytes Relative: 8 %
NEUTROS PCT: 52 %
Neutro Abs: 4.2 10*3/uL (ref 1.4–6.5)

## 2015-12-10 LAB — APTT: aPTT: 25 seconds (ref 24–36)

## 2015-12-10 LAB — PROTIME-INR
INR: 1.1
PROTHROMBIN TIME: 14.4 s (ref 11.4–15.0)

## 2015-12-10 LAB — ETHANOL: Alcohol, Ethyl (B): <5 mg/dL (ref <5)

## 2015-12-10 LAB — TROPONIN I: Troponin I: <0.03 ng/mL (ref <0.03)

## 2015-12-10 MED ORDER — ONDANSETRON HCL 4 MG/2ML IJ SOLN
INTRAMUSCULAR | Status: AC
  Filled 2015-12-10: qty 2

## 2015-12-10 MED ORDER — DIPHENHYDRAMINE HCL 50 MG/ML IJ SOLN
25.0000 mg | Freq: Once | INTRAMUSCULAR | Status: AC
  Administered 2015-12-10: 25 mg via INTRAVENOUS

## 2015-12-10 MED ORDER — ASPIRIN 81 MG PO CHEW
CHEWABLE_TABLET | ORAL | Status: AC
  Filled 2015-12-10: qty 3

## 2015-12-10 MED ORDER — ONDANSETRON HCL 4 MG/2ML IJ SOLN
4.0000 mg | Freq: Once | INTRAMUSCULAR | Status: AC
  Administered 2015-12-10: 4 mg via INTRAVENOUS

## 2015-12-10 MED ORDER — DIPHENHYDRAMINE HCL 50 MG/ML IJ SOLN
INTRAMUSCULAR | Status: AC
  Filled 2015-12-10: qty 1

## 2015-12-10 MED ORDER — KETOROLAC TROMETHAMINE 30 MG/ML IJ SOLN
30.0000 mg | Freq: Once | INTRAMUSCULAR | Status: AC
  Administered 2015-12-10: 30 mg via INTRAVENOUS
  Filled 2015-12-10: qty 1

## 2015-12-10 MED ORDER — ASPIRIN 81 MG PO CHEW
243.0000 mg | CHEWABLE_TABLET | Freq: Once | ORAL | Status: AC
  Administered 2015-12-10: 243 mg via ORAL

## 2015-12-10 MED ORDER — POTASSIUM CHLORIDE CRYS ER 20 MEQ PO TBCR
40.0000 meq | EXTENDED_RELEASE_TABLET | Freq: Once | ORAL | Status: AC
  Administered 2015-12-11: 40 meq via ORAL
  Filled 2015-12-10: qty 2

## 2015-12-10 MED ORDER — ASPIRIN 81 MG PO CHEW
81.0000 mg | CHEWABLE_TABLET | Freq: Once | ORAL | Status: AC
  Administered 2015-12-10: 81 mg via ORAL
  Filled 2015-12-10: qty 1

## 2015-12-10 MED ORDER — SODIUM CHLORIDE 0.9 % IV BOLUS (SEPSIS)
1000.0000 mL | Freq: Once | INTRAVENOUS | Status: AC
  Administered 2015-12-10: 1000 mL via INTRAVENOUS

## 2015-12-10 NOTE — ED Provider Notes (Signed)
[facility] Emergency Department Provider Note   ____________________________________________  Time seen: approx 10pm I have reviewed the triage vital signs and the triage nursing note.  HISTORY  Chief Complaint Pruritis and Numbness   Historian Patient  HPI Meagan Carr is a 60 y.o. female is here with a complaint of itching to the left side of her face. When he speak to her further she states that it feels funny. This happened yesterday for several hours and then went away. This evening around 1 hour prior to arrival, so about 8 PM, the patient had acute onset of the itching paresthesia to the left side of the face. She lives alone and did not notice if she was having any word finding problems or slurred speech. She also noticed around 8 PM heaviness to the left arm. She was able to walk in.  She is reporting a headache which is moderate and global. No history of typical headaches or migraines in the past. No fever, confusion, or coordination problems.  No chest pain.    Past Medical History  Diagnosis Date  . S/P colonoscopy with polypectomy 2001    [place], polyps, unsure what type  . S/P colonoscopy 2008    Normal    Patient Active Problem List   Diagnosis Date Noted  . Abdominal pain 09/13/2010  . Melena 09/13/2010    Past Surgical History  Procedure Laterality Date  . C-section      X2  . Appendectomy    . Cholecystectomy    . Abdominal hysterectomy      No current outpatient prescriptions on file.  Allergies Review of patient's allergies indicates no known allergies.  Family History  Problem Relation Age of Onset  . Breast cancer Mother     metastasized to bones, deceased age 60  . Brain cancer Father     deceased age 60  . Colon cancer Neg Hx     Social History Social History  Substance Use Topics  . Smoking status: Never Smoker   . Smokeless tobacco: Never Used  . Alcohol Use: No    Review of  Systems  Constitutional: Negative for fever. Eyes: Negative for visual changes. ENT: Negative for sore throat. Cardiovascular: Negative for chest pain. Respiratory: Negative for shortness of breath. Gastrointestinal: Negative for abdominal pain, vomiting and diarrhea. Genitourinary: Negative for dysuria. Musculoskeletal: Negative for back pain. Skin: Negative for rash. Neurological: Positive for headache. 10 point Review of Systems otherwise negative ____________________________________________   PHYSICAL EXAM:  VITAL SIGNS: ED Triage Vitals  Enc Vitals Group     BP 12/10/15 2102 173/97 mmHg     Pulse Rate 12/10/15 2102 92     Resp 12/10/15 2102 18     Temp 12/10/15 2102 97.8 F (36.6 C)     Temp Source 12/10/15 2102 Oral     SpO2 12/10/15 2102 94 %     Weight --      Height --      Head Cir --      Peak Flow --      Pain Score 12/10/15 2109 7     Pain Loc --      Pain Edu? --      Excl. in GC? --      Constitutional: Alert and oriented. Well appearing Overall and in no distress. HEENT   Head: Normocephalic and atraumatic.      Eyes: Conjunctivae are normal. PERRL. Normal extraocular movements.      Ears:  Nose: No congestion/rhinnorhea.   Mouth/Throat: Mucous membranes are moist.   Neck: No stridor. Cardiovascular/Chest: Normal rate, regular rhythm.  No murmurs, rubs, or gallops. Respiratory: Normal respiratory effort without tachypnea nor retractions. Breath sounds are clear and equal bilaterally. No wheezes/rales/rhonchi. Gastrointestinal: Soft. No distention, no guarding, no rebound. Nontender.    Genitourinary/rectal:Deferred Musculoskeletal: Nontender with normal range of motion in all extremities. No joint effusions.  No lower extremity tenderness.  No edema. Neurologic:  Face rate questionable left lip droop, but when asked to smile it looks normal and equal. Paresthesia left face V1, V2 and V3. Mild decreased left grip strength with respect  to the right, with mild left arm drift. Mild left leg drift. Overall poor effort with exam. Normal speech and language. Paresthesia left arm and left leg. Skin:  Skin is warm, dry and intact. No rash noted. Psychiatric: Mood and affect are normal. Speech and behavior are normal. Patient exhibits appropriate insight and judgment.  ____________________________________________   EKG I, Governor Rooks, MD, the attending physician have personally viewed and interpreted all ECGs.  82 bpm. Normal sinus rhythm. Narrow QRS. Normal axis. Inverted T waves anteriorly. Unchanged from prior. ____________________________________________  LABS (pertinent positives/negatives)  Labs Reviewed  COMPREHENSIVE METABOLIC PANEL - Abnormal; Notable for the following:    Potassium 3.2 (*)    Glucose, Bld 114 (*)    Total Bilirubin 0.1 (*)    All other components within normal limits  ETHANOL  PROTIME-INR  APTT  CBC  DIFFERENTIAL  TROPONIN I  URINE DRUG SCREEN, QUALITATIVE ([facility] ONLY)    ____________________________________________  RADIOLOGY All Xrays were viewed by me. Imaging interpreted by Radiologist.  CT without contrast: No acute findings. Normal brain. __________________________________________  PROCEDURES  Procedure(s) performed: None  Critical Care performed: CRITICAL CARE Performed by: Governor Rooks   Total critical care time: 30 minutes  Critical care time was exclusive of separately billable procedures and treating other patients.  Critical care was necessary to treat or prevent imminent or life-threatening deterioration.  Critical care was time spent personally by me on the following activities: development of treatment plan with patient and/or surrogate as well as nursing, discussions with consultants, evaluation of patient's response to treatment, examination of patient, obtaining history from patient or surrogate, ordering and performing treatments and interventions, ordering  and review of laboratory studies, ordering and review of radiographic studies, pulse oximetry and re-evaluation of patient's condition.   ____________________________________________   ED COURSE / ASSESSMENT AND PLAN  Pertinent labs & imaging results that were available during my care of the patient were reviewed by me and considered in my medical decision making (see chart for details).  This patient initially reported itching of the face as her symptoms, but nurse noted that with additional history it seems like it was somewhat acute onset tonight at 8 PM and associated with some paresthesias to the left arm and left leg and weakness of the left arm and left leg.  I did come at that point evaluated the patient and agree that she does have some weakness on the left upper and lower extremities with comparison to the right, although she has a bit of a poor historian and is not really fully cooperating well with even the right hand strength exam.  She was reportedly able to walk in, so it doesn't seem like the deficits are severe.  Given the acute onset and the neurologic symptoms, I did initiate code stroke. The patient had already received a head CT and  this was negative.  NIH stroke scale 7 with the specialist on-call neurologist. Given the patient's cooperation with the exam somewhat questionable and the headache making complex migraine a significant possibility, it was recommended against TPA at this point time.  I spoke by phone with the consulting tele-neurologist.  Patient was given 4 baby aspirin. Patient was given medication for headache. She'll be admitted to the hospitalist.    CONSULTATIONS:  Tele-neurologist by phone in video conference. Hospitalist for admission.   Patient / Family / Caregiver informed of clinical course, medical decision-making process, and agree with plan.   ___________________________________________   FINAL CLINICAL IMPRESSION(S) / ED  DIAGNOSES   Final diagnoses:  Numbness and tingling of left side of face  Weakness of left arm  Weakness of left leg  Numbness and tingling of left arm and leg  Hypokalemia              Note: This dictation was prepared with Dragon dictation. Any transcriptional errors that result from this process are unintentional   Governor Rooks, MD 12/10/15 [phone]

## 2015-12-10 NOTE — ED Notes (Addendum)
In to assess pt. Pt appears with weaker left side than right and has "different" sensation to left side than right. md notified and in to assess pt. Pt states left sided arm heaviness that began approx one hour pta. Pt states she had left sided facial and temporal head "itching" yesterday that "went away" and came back approx 1 hour pta with left sided arm heaviness.

## 2015-12-10 NOTE — ED Notes (Signed)
Dr. Laurell JosephsBurke with tele neuro beginning his assessment.

## 2015-12-10 NOTE — ED Notes (Addendum)
Pt reports started yesterday with itching on the left temporal region of her face that then started to feel like pins and needles. Pt reports this went away and came back today around 1 hour ago that has not gone away. Pt also reports around 4 pm she developed a "toxic taste in my mouth" that lasted a few minutes and went away. Pt also reports had that taste again right before coming to the ED.

## 2015-12-10 NOTE — ED Notes (Signed)
Pt states has begun since arrival to ed to have posterior headache. Pt states has "more blurry" vision than normal yesterday and today. Pt states "i have felt tired and weird". Pt states she has sensitivity to light but not sound. Pt states has had left arm heaviness, but denies shob. Pt with normal color and warm/dry skin.

## 2015-12-11 ENCOUNTER — Observation Stay: Payer: BLUE CROSS/BLUE SHIELD

## 2015-12-11 ENCOUNTER — Encounter: Payer: Self-pay | Admitting: *Deleted

## 2015-12-11 DIAGNOSIS — G459 Transient cerebral ischemic attack, unspecified: Secondary | ICD-10-CM | POA: Diagnosis present

## 2015-12-11 LAB — URINE DRUG SCREEN, QUALITATIVE (ARMC ONLY)
Amphetamines, Ur Screen: NOT DETECTED
Barbiturates, Ur Screen: NOT DETECTED
Benzodiazepine, Ur Scrn: NOT DETECTED
Cannabinoid 50 Ng, Ur ~~LOC~~: NOT DETECTED
Cocaine Metabolite,Ur ~~LOC~~: NOT DETECTED
MDMA (Ecstasy)Ur Screen: NOT DETECTED
Methadone Scn, Ur: NOT DETECTED
Opiate, Ur Screen: NOT DETECTED
Phencyclidine (PCP) Ur S: NOT DETECTED
Tricyclic, Ur Screen: NOT DETECTED

## 2015-12-11 LAB — LIPID PANEL
Cholesterol: 139 mg/dL (ref 0–200)
HDL: 50 mg/dL (ref >40)
LDL Cholesterol: 58 mg/dL (ref 0–99)
Total CHOL/HDL Ratio: 2.8 RATIO
Triglycerides: 153 mg/dL — ABNORMAL HIGH (ref <150)
VLDL: 31 mg/dL (ref 0–40)

## 2015-12-11 LAB — TSH: TSH: 3.627 u[IU]/mL (ref 0.350–4.500)

## 2015-12-11 LAB — HEMOGLOBIN A1C: Hgb A1c MFr Bld: 5.8 % (ref 4.0–6.0)

## 2015-12-11 MED ORDER — ACETAMINOPHEN 325 MG PO TABS
650.0000 mg | ORAL_TABLET | Freq: Four times a day (QID) | ORAL | Status: DC | PRN
  Administered 2015-12-11: 17:00:00 650 mg via ORAL
  Filled 2015-12-11: qty 2

## 2015-12-11 MED ORDER — ASPIRIN 81 MG PO CHEW
81.0000 mg | CHEWABLE_TABLET | Freq: Every day | ORAL | Status: DC
  Administered 2015-12-11: 11:00:00 81 mg via ORAL
  Filled 2015-12-11: qty 1

## 2015-12-11 MED ORDER — SODIUM CHLORIDE 0.9% FLUSH
3.0000 mL | Freq: Two times a day (BID) | INTRAVENOUS | Status: DC
  Administered 2015-12-11 (×2): 3 mL via INTRAVENOUS

## 2015-12-11 MED ORDER — ONDANSETRON HCL 4 MG PO TABS: 4.0000 mg | ORAL_TABLET | Freq: Four times a day (QID) | ORAL | Status: DC | PRN

## 2015-12-11 MED ORDER — DOCUSATE SODIUM 100 MG PO CAPS
100.0000 mg | ORAL_CAPSULE | Freq: Two times a day (BID) | ORAL | Status: DC
  Administered 2015-12-11: 100 mg via ORAL
  Filled 2015-12-11: qty 1

## 2015-12-11 MED ORDER — MORPHINE SULFATE (PF) 2 MG/ML IV SOLN
2.0000 mg | INTRAVENOUS | Status: DC | PRN
  Administered 2015-12-11: 2 mg via INTRAVENOUS
  Filled 2015-12-11: qty 1

## 2015-12-11 MED ORDER — HYDROCODONE-ACETAMINOPHEN 5-325 MG PO TABS: 1.0000 | ORAL_TABLET | ORAL | Status: DC | PRN

## 2015-12-11 MED ORDER — ASPIRIN 81 MG PO CHEW: 81.0000 mg | CHEWABLE_TABLET | Freq: Every day | ORAL | Status: DC

## 2015-12-11 MED ORDER — ACETAMINOPHEN 650 MG RE SUPP: 650.0000 mg | Freq: Four times a day (QID) | RECTAL | Status: DC | PRN

## 2015-12-11 MED ORDER — ONDANSETRON HCL 4 MG/2ML IJ SOLN: 4.0000 mg | Freq: Four times a day (QID) | INTRAMUSCULAR | Status: DC | PRN

## 2015-12-11 MED ORDER — ENOXAPARIN SODIUM 40 MG/0.4ML ~~LOC~~ SOLN: 40.0000 mg | SUBCUTANEOUS | Status: DC

## 2015-12-11 NOTE — Discharge Instructions (Signed)
Heart healthy diet. °Activity as tolerated. °

## 2015-12-11 NOTE — Discharge Summary (Addendum)
[facility] [place] at [facility]   PATIENT NAME: Meagan Carr    MR#:  [id number]  DATE OF BIRTH:  1955-07-05  DATE OF ADMISSION:  12/10/2015 ADMITTING PHYSICIAN: Arnaldo Natal, MD  DATE OF DISCHARGE: 12/11/2015 PRIMARY CARE PHYSICIAN: No PCP Per Patient    ADMISSION DIAGNOSIS:  Hypokalemia [E87.6] Weakness of left leg [R29.898] Weakness of left arm [R29.898] Numbness and tingling of left arm and leg [R20.2] Numbness and tingling of left side of face [R20.0]  DISCHARGE DIAGNOSIS:  Active Problems:   TIA (transient ischemic attack) Numbness and tingling of left arm and leg/ hypokalemia.  SECONDARY DIAGNOSIS:   Past Medical History  Diagnosis Date  . S/P colonoscopy with polypectomy 2001    [place], polyps, unsure what type  . S/P colonoscopy 2008    Normal  . Hypertension     HOSPITAL COURSE:  Numbness and tingling of left arm and leg, possible TIA.  Continue ASA. MRI brain is negative. Lipid is normal. Carotid US: no stenosis. Hypokalemia. Given KCl, mag is normal. Obesity.   DISCHARGE CONDITIONS:   Stable, discharge to home today.  CONSULTS OBTAINED:  Treatment Team:  Pauletta Browns, MD  DRUG ALLERGIES:  No Known Allergies  DISCHARGE MEDICATIONS:   Current Discharge Medication List    START taking these medications   Details  aspirin 81 MG chewable tablet Chew 1 tablet (81 mg total) by mouth daily. Qty: 30 tablet, Refills: 2         DISCHARGE LOCATION:    If you experience worsening of your admission symptoms, develop shortness of breath, life threatening emergency, suicidal or homicidal thoughts you must seek medical attention immediately by calling 911 or calling your MD immediately  if symptoms less severe.  You Must read complete instructions/literature along with all the possible adverse reactions/side effects for all the Medicines you take and that have been prescribed to you. Take any new Medicines  after you have completely understood and accpet all the possible adverse reactions/side effects.   Please note  You were cared for by a hospitalist during your hospital stay. If you have any questions about your discharge medications or the care you received while you were in the hospital after you are discharged, you can call the unit and asked to speak with the hospitalist on call if the hospitalist that took care of you is not available. Once you are discharged, your primary care physician will handle any further medical issues. Please note that NO REFILLS for any discharge medications will be authorized once you are discharged, as it is imperative that you return to your primary care physician (or establish a relationship with a primary care physician if you do not have one) for your aftercare needs so that they can reassess your need for medications and monitor your lab values.    On the day of Discharge:  VITAL SIGNS:  Blood pressure 131/77, pulse 72, temperature 97.8 F (36.6 C), temperature source Oral, resp. rate 12, height 5\' 3"  (1.6 m), weight 215 lb 4.8 oz (97.659 kg), SpO2 96 %.  PHYSICAL EXAMINATION:  GENERAL:  60 y.o.-year-old patient lying in the bed with no acute distress. Obese. EYES: Pupils equal, round, reactive to light and accommodation. No scleral icterus. Extraocular muscles intact.  HEENT: Head atraumatic, normocephalic. Oropharynx and nasopharynx clear.  NECK:  Supple, no jugular venous distention. No thyroid enlargement, no tenderness.  LUNGS: Normal breath sounds bilaterally, no wheezing, rales,rhonchi or crepitation. No use of  accessory muscles of respiration.  CARDIOVASCULAR: S1, S2 normal. No murmurs, rubs, or gallops.  ABDOMEN: Soft, non-tender, non-distended. Bowel sounds present. No organomegaly or mass.  EXTREMITIES: No pedal edema, cyanosis, or clubbing.  NEUROLOGIC: Cranial nerves II through XII are intact. Muscle strength 5/5 in all extremities. Sensation  intact. Gait not checked.  PSYCHIATRIC: The patient is alert and oriented x 3.  SKIN: No obvious rash, lesion, or ulcer.  DATA REVIEW:   CBC  Recent Labs Lab 12/10/15 2221  WBC 8.0  HGB 12.2  HCT 35.8  PLT 246    Chemistries   Recent Labs Lab 12/10/15 2221  NA 141  K 3.2*  CL 105  CO2 29  GLUCOSE 114*  BUN 13  CREATININE 0.77  CALCIUM 9.3  AST 21  ALT 21  ALKPHOS 85  BILITOT 0.1*    Cardiac Enzymes  Recent Labs Lab 12/10/15 2221  TROPONINI <0.03    Microbiology Results  No results found for this or any previous visit.  RADIOLOGY:  Ct Head Wo Contrast  12/10/2015  CLINICAL DATA:  Facial numbness EXAM: CT HEAD WITHOUT CONTRAST TECHNIQUE: Contiguous axial images were obtained from the base of the skull through the vertex without intravenous contrast. COMPARISON:  03/25/05 FINDINGS: Brain: No evidence of acute infarction, hemorrhage, extra-axial collection, ventriculomegaly, or mass effect. Vascular: No hyperdense vessel or unexpected calcification. Skull: Negative for fracture or focal lesion. Sinuses/Orbits: No acute findings. Other: None. IMPRESSION: 1. No acute findings.  Normal brain. Electronically Signed   By: Signa Kell M.D.   On: 12/10/2015 21:35   US Carotid Bilateral  12/11/2015  CLINICAL DATA:  CVA. Syncopal episode. Blurry vision. History of hypertension. EXAM: BILATERAL CAROTID DUPLEX ULTRASOUND TECHNIQUE: Wallace Cullens scale imaging, color Doppler and duplex ultrasound were performed of bilateral carotid and vertebral arteries in the neck. COMPARISON:  Head CT - 12/10/2015 FINDINGS: Criteria: Quantification of carotid stenosis is based on velocity parameters that correlate the residual internal carotid diameter with NASCET-based stenosis levels, using the diameter of the distal internal carotid lumen as the denominator for stenosis measurement. The following velocity measurements were obtained: RIGHT ICA:  62/25 cm/sec CCA:  72/20 cm/sec SYSTOLIC ICA/CCA  RATIO:  0.9 DIASTOLIC ICA/CCA RATIO:  1.2 ECA:  67 cm/sec LEFT ICA:  66/22 cm/sec CCA:  77/19 cm/sec SYSTOLIC ICA/CCA RATIO:  0.9 DIASTOLIC ICA/CCA RATIO:  1.2 ECA:  56 cm/sec RIGHT CAROTID ARTERY: There is a minimal amount of intimal wall thickening involving the origin and proximal aspect the right internal carotid artery (image 25), not resulting in an elevated peak systolic velocities within the interrogated course of the right internal carotid artery to suggest a hemodynamically significant stenosis. RIGHT VERTEBRAL ARTERY:  Antegrade Flow LEFT CAROTID ARTERY: There is no grayscale evidence of significant intimal thickening or atherosclerotic plaque affecting the interrogated portions of the left carotid system. There are no elevated peak systolic velocities within the interrogated course of the left internal carotid artery to suggest a hemodynamically significant stenosis. LEFT VERTEBRAL ARTERY:  Antegrade Flow IMPRESSION: 1. Minimal amount of right-sided intimal wall thickening, not resulting in a hemodynamically significant stenosis. 2. Normal sonographic evaluation of the right carotid system. Electronically Signed   By: Simonne Come M.D.   On: 12/11/2015 11:14     Management plans discussed with the patient, family and they are in agreement.  CODE STATUS:     Code Status Orders        Start     Ordered   12/11/15 [id number]  Full code   Continuous     12/11/15 0047    Code Status History    Date Active Date Inactive Code Status Order ID Comments User Context   This patient has a current code status but no historical code status.      TOTAL TIME TAKING CARE OF THIS PATIENT: 35 minutes.    Shaune Pollackhen, Draylen Lobue M.D on 12/11/2015 at 2:23 PM  Between 7am to 6pm - Pager - [phone]  After 6pm go to [email or web address] - password [email or web address] [facility]  Office  [phone]  CC: Primary care physician; No PCP Per Patient   Note: This dictation was prepared with Dragon dictation  along with smaller phrase technology. Any transcriptional errors that result from this process are unintentional.

## 2015-12-11 NOTE — Progress Notes (Signed)
Patient is alert and oriented x 4, morphine given over night, drowsy at start of shift that resolved with rest. MRI and US carotids was unremarkable, ambulatory in room, up to bathroom independent, NSR on tele, pt is d/c to home, RX for aspirin given, pt is instructed to f/u with pcp within 2 weeks, patient had questions about discharge and spoke with Dr. Imogene Burnhen via telephone concerning questions, patient reports understanding of d/c instructions and has no further questions at this time. Pt is pushed to visitor entrance via nursing staff, ride provided by family.

## 2016-01-18 ENCOUNTER — Emergency Department
Admission: EM | Admit: 2016-01-18 | Discharge: 2016-01-18 | Disposition: A | Discharge status: Home / Self Care | Payer: BLUE CROSS/BLUE SHIELD | Attending: Student in an Organized Health Care Education/Training Program | Admitting: Student in an Organized Health Care Education/Training Program

## 2016-01-18 ENCOUNTER — Emergency Department: Payer: BLUE CROSS/BLUE SHIELD

## 2016-01-18 ENCOUNTER — Encounter: Payer: Self-pay | Admitting: Medical Oncology

## 2016-01-18 DIAGNOSIS — R51 Headache: Secondary | ICD-10-CM | POA: Diagnosis present

## 2016-01-18 DIAGNOSIS — G44209 Tension-type headache, unspecified, not intractable: Secondary | ICD-10-CM | POA: Diagnosis not present

## 2016-01-18 DIAGNOSIS — Z7982 Long term (current) use of aspirin: Secondary | ICD-10-CM | POA: Diagnosis not present

## 2016-01-18 DIAGNOSIS — I10 Essential (primary) hypertension: Secondary | ICD-10-CM | POA: Diagnosis not present

## 2016-01-18 MED ORDER — KETOROLAC TROMETHAMINE 30 MG/ML IJ SOLN
30.0000 mg | Freq: Once | INTRAMUSCULAR | Status: AC
  Administered 2016-01-18: 30 mg via INTRAMUSCULAR
  Filled 2016-01-18: qty 1

## 2016-01-18 MED ORDER — SUMATRIPTAN SUCCINATE 50 MG PO TABS: 50.0000 mg | ORAL_TABLET | Freq: Once | ORAL | 0 refills | Status: DC | PRN

## 2016-01-18 NOTE — ED Triage Notes (Signed)
Pt reports "knot" to the rt side of her forehead that is painful. Denies injury.

## 2016-01-18 NOTE — ED Provider Notes (Signed)
[facility]-EMERGENCY DEPARTMENT Provider Note   CSN: [id number] Arrival date & time: 01/18/16  1342     History   Chief Complaint Chief Complaint  Patient presents with  . Abscess    HPI Meagan Carr is a [age] y.o. female.  60 year old female presents today complaining of headache and 'knot' on right side of head for the past 3 days. Denies head injury. States that she has had a right sided headache and pain on either side of her neck ever since the symptoms started. She has taken multiple over the counter medications without relief. No nausea, vomiting, vision changes, focal weakness.    The history is provided by the patient.   Associated symptoms: headaches   Associated symptoms: no fever   Migraine  This is a new problem. The current episode started more than 2 days ago. The problem occurs constantly. The problem has not changed since onset.Associated symptoms include headaches. Nothing relieves the symptoms. She has tried acetaminophen and ASA for the symptoms. The treatment provided moderate relief.    Past Medical History:  Diagnosis Date  . Hypertension   . S/P colonoscopy 2008   Normal  . S/P colonoscopy with polypectomy 2001   [place], polyps, unsure what type    Patient Active Problem List   Diagnosis Date Noted  . TIA (transient ischemic attack) 12/11/2015  . Abdominal pain 09/13/2010  . Melena 09/13/2010    Past Surgical History:  Procedure Laterality Date  . ABDOMINAL HYSTERECTOMY    . APPENDECTOMY    . c-section     X2  . CHOLECYSTECTOMY      OB History    No data available       Home Medications    Prior to Admission medications   Medication Sig Start Date End Date Taking? Authorizing Provider  aspirin 81 MG chewable tablet Chew 1 tablet (81 mg total) by mouth daily. 12/11/15   Shaune PollackQing Chen, MD  SUMAtriptan (IMITREX) 50 MG tablet Take 1 tablet (50 mg total) by mouth once as needed (max of 2 tabs per day). May repeat in 2 hours if headache  persists or recurs. 01/18/16 01/18/17  Christella ScheuermannEmma V Atiyah Bauer, PA-C    Family History Family History  Problem Relation Age of Onset  . Breast cancer Mother     metastasized to bones, deceased age 60  . Brain cancer Father     deceased age 60  . Colon cancer Neg Hx     Social History Social History  Substance Use Topics  . Smoking status: Never Smoker  . Smokeless tobacco: Never Used  . Alcohol use No     Allergies   Review of patient's allergies indicates no known allergies.   Review of Systems Review of Systems  Constitutional: Negative for chills and fever.  Eyes: Negative for photophobia, pain and visual disturbance.  Neurological: Positive for headaches. Negative for dizziness, syncope, speech difficulty, weakness, light-headedness and numbness.  All other systems reviewed and are negative.    Physical Exam Updated Vital Signs BP (!) 141/93 (BP Location: Left Arm)   Pulse 88   Temp 98.1 F (36.7 C) (Oral)   Resp 18   Ht 5\' 3"  (1.6 m)   Wt 97.1 kg   SpO2 97%   BMI 37.91 kg/m   Physical Exam  Constitutional: She is oriented to person, place, and time. She appears well-developed and well-nourished.  HENT:  Head: Normocephalic and atraumatic.  Right Ear: Hearing, tympanic membrane, external ear and  ear canal normal.  Left Ear: Hearing, tympanic membrane, external ear and ear canal normal.  Nose: Nose normal.  Mouth/Throat: Uvula is midline, oropharynx is clear and moist and mucous membranes are normal.  Eyes: Conjunctivae and EOM are normal. Pupils are equal, round, and reactive to light.  Neck: Trachea normal, normal range of motion and phonation normal. Neck supple.  Bilateral trapezius TTP with spasm   Neurological: She is alert and oriented to person, place, and time. She displays normal reflexes. No cranial nerve deficit. She exhibits normal muscle tone. Coordination normal.  Skin: Skin is warm and dry. Capillary refill takes less than 2 seconds.  Psychiatric:  She has a normal mood and affect. Her behavior is normal. Judgment and thought content normal.  Nursing note and vitals reviewed.    ED Treatments / Results  Labs (all labs ordered are listed, but only abnormal results are displayed) Labs Reviewed - No data to display  EKG  EKG Interpretation None       Radiology Ct Head Wo Contrast  Result Date: 01/18/2016 CLINICAL DATA:  Headache, nausea and dizziness. Area of palpable concern on the right side of patient's head. EXAM: CT HEAD WITHOUT CONTRAST TECHNIQUE: Contiguous axial images were obtained from the base of the skull through the vertex without intravenous contrast. COMPARISON:  Brain MRI 12/11/2015 FINDINGS: Brain: No evidence of acute infarction, hemorrhage, hydrocephalus, extra-axial collection or mass lesion/mass effect. Vascular: No hyperdense vessel or unexpected calcification. Skull: No suspicious lesions or displaced fractures. Sinuses/Orbits: No acute finding. Other: None. IMPRESSION: No acute intracranial abnormality. Electronically Signed   By: Ted Mcalpineobrinka  Dimitrova M.D.   On: 01/18/2016 15:18    Procedures Procedures (including critical care time)  Medications Ordered in ED Medications  ketorolac (TORADOL) 30 MG/ML injection 30 mg (30 mg Intramuscular Given 01/18/16 1511)     Initial Impression / Assessment and Plan / ED Course  I have reviewed the triage vital signs and the nursing notes.  Pertinent labs & imaging results that were available during my care of the patient were reviewed by me and considered in my medical decision making (see chart for details).  Clinical Course    Negative head CT, normal neuro exam. Likely suffering from tension headaches. No swelling on exam or visible on CT at pt feels "knot" on her temple. Toradol 30mg  IM given in Er with relief. Imitrex 50mg  as needed for headaches and info on neurologist to follow up with outpatient. Return for new or worsening symptoms   Final Clinical  Impressions(s) / ED Diagnoses   Final diagnoses:  Tension headache    New Prescriptions Discharge Medication List as of 01/18/2016  3:28 PM    START taking these medications   Details  SUMAtriptan (IMITREX) 50 MG tablet Take 1 tablet (50 mg total) by mouth once as needed (max of 2 tabs per day). May repeat in 2 hours if headache persists or recurs., Starting Tue 01/18/2016, Until Thu 01/18/2017, Print         Christella ScheuermannEmma V Cayla Wiegand, PA-C 01/18/16 1609    Emily FilbertJonathan E Williams, MD 01/19/16 [phone]

## 2016-01-18 NOTE — ED Notes (Signed)
Pt presents with "knot" on R side of her head, C/O headaches. Nausea and dizziness since she noticed knot

## 2016-07-13 DIAGNOSIS — R635 Abnormal weight gain: Secondary | ICD-10-CM | POA: Insufficient documentation

## 2016-07-13 DIAGNOSIS — E669 Obesity, unspecified: Secondary | ICD-10-CM | POA: Insufficient documentation

## 2016-07-27 IMAGING — MR MR ELBOW*L* WO/W CM
8 of 9 series · 35 of 40 positions shown · IV contrast (multihance)
Comparison: Plain films of the left elbow 07/03/2015. Ultrasound
left upper extremity 07/02/2015.

CLINICAL DATA: Knot on the anterior aspect of the distal humerus to
the elbow for 3 weeks. Pain. Initial encounter.

EXAM:
MRI OF THE LEFT ELBOW WITHOUT AND WITH CONTRAST
TECHNIQUE: Multiplanar, multisequence MR imaging of the elbow was performed
before and after the administration of intravenous contrast.
CONTRAST:  20 mL MULTIHANCE GADOBENATE DIMEGLUMINE 529 MG/ML IV SOLN

[Series 14: T1 fat-sat · sagittal · 5.0mm · 0.52mm/px · 4 of 22 slices shown (1 of 2)]
[im 1/22]
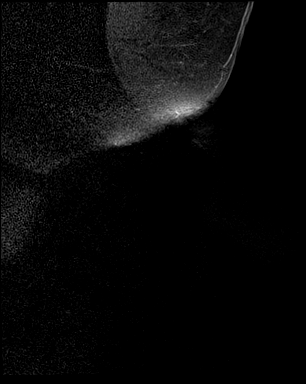
[im 8/22]
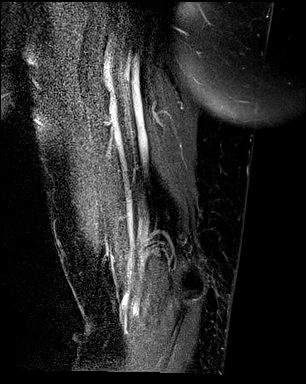
[im 15/22]
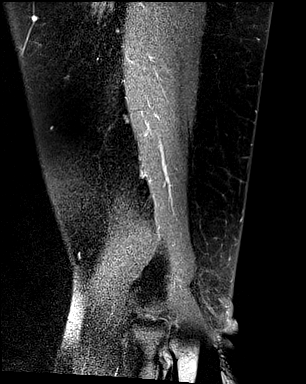
[im 22/22]
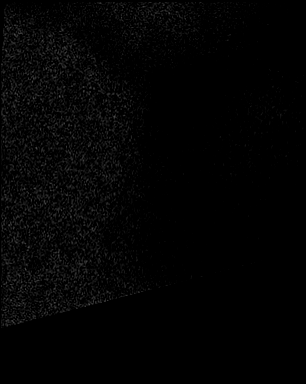

[Series 100: ax t1_fil_1 · axial · 4.0mm · 0.62mm/px · z∈[-150,+11]mm · 6 of 35 slices shown]
[im 1/35]
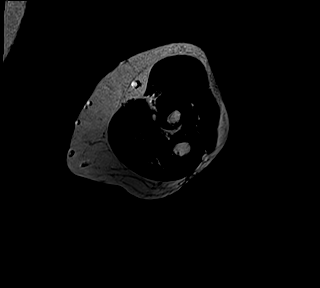
[im 7/35]
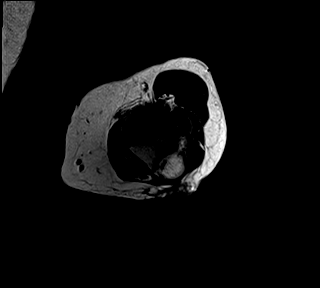
[im 14/35]
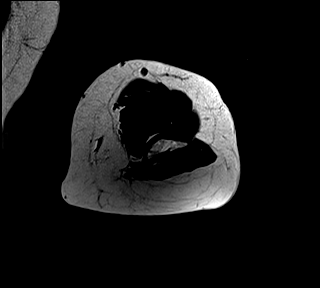
[im 21/35]
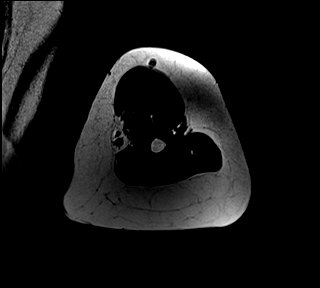
[im 28/35]
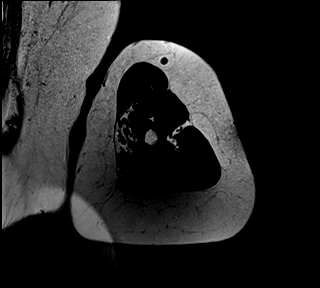
[im 35/35]
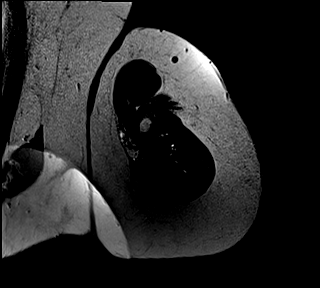

[Series 101: T1 · axial · 4.0mm · 0.62mm/px · z∈[-150,+11]mm · 5 of 35 slices shown (1 of 2)]
[im 1/35]
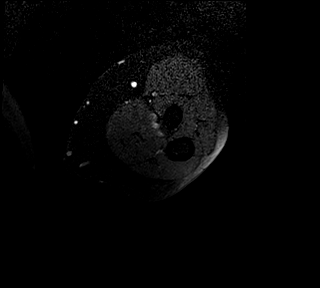
[im 9/35]
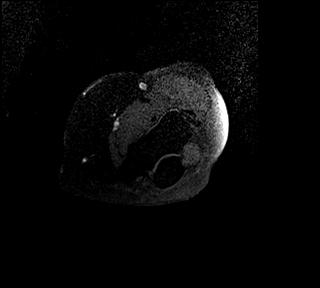
[im 18/35]
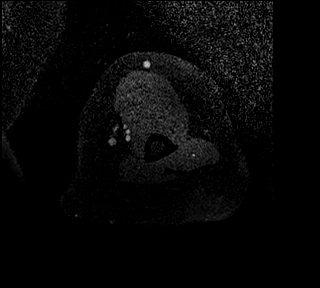
[im 26/35]
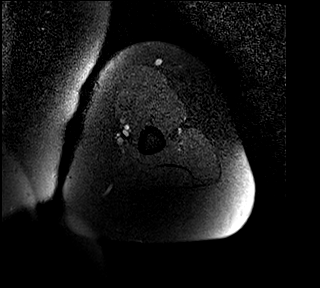
[im 35/35]
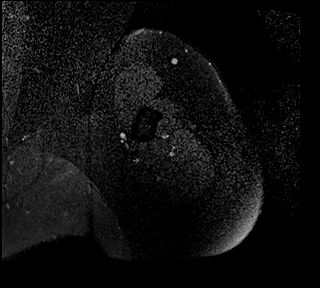

[Series 102: T2 · axial · 4.0mm · 0.39mm/px · z∈[-152,+8]mm · 5 of 35 slices shown (1 of 2)]
[im 1/35]
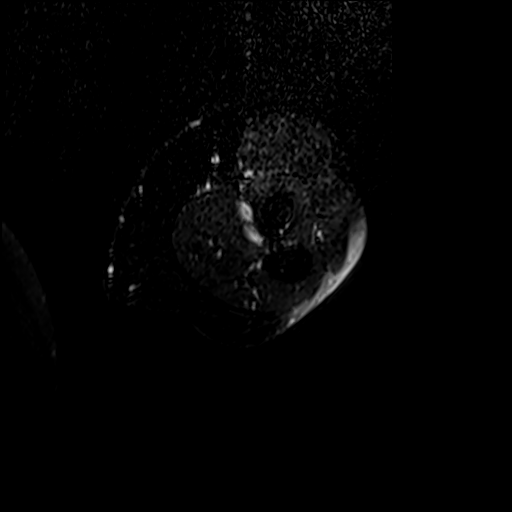
[im 9/35]
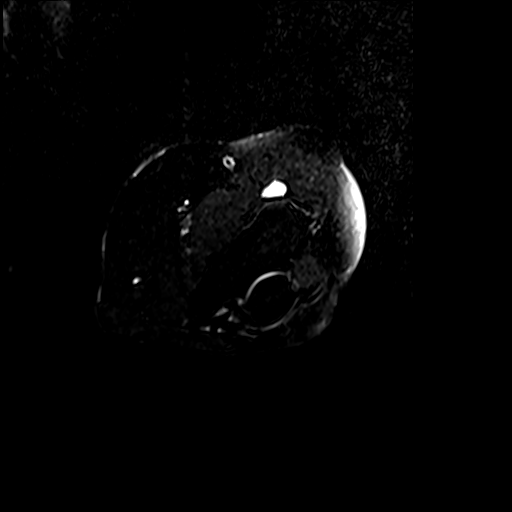
[im 18/35]
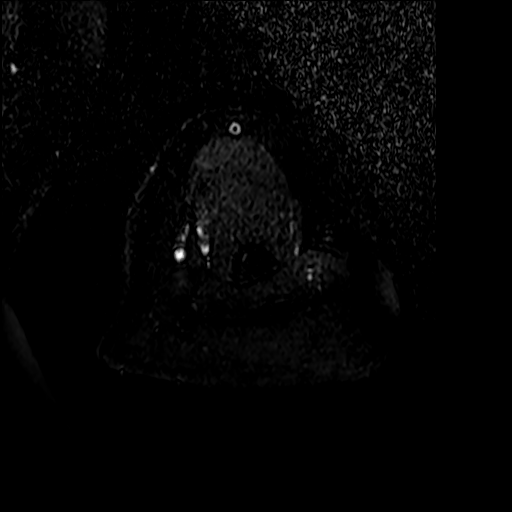
[im 26/35]
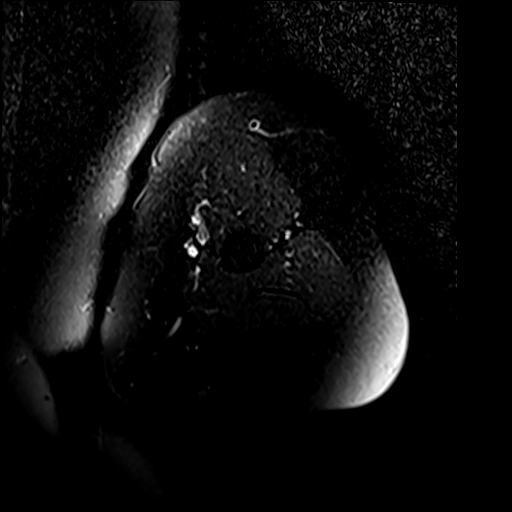
[im 35/35]
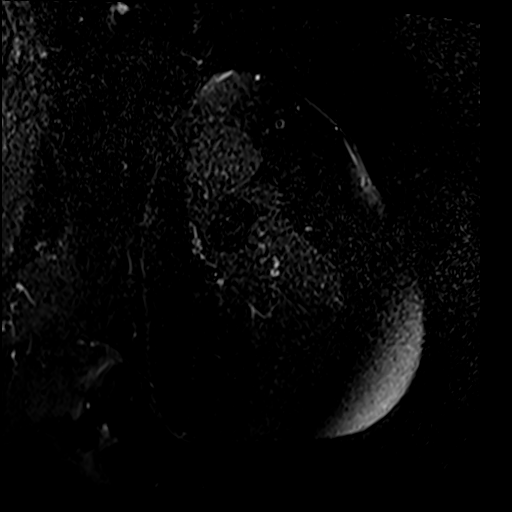

[Series 104: sag t1_fil_1 · sagittal · 5.0mm · 0.52mm/px · 3 of 22 slices shown]
[im 1/22]
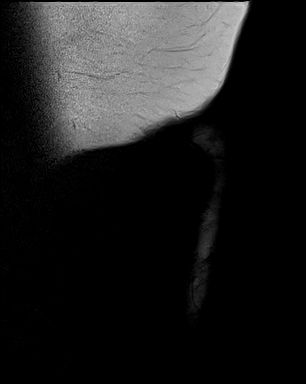
[im 11/22]
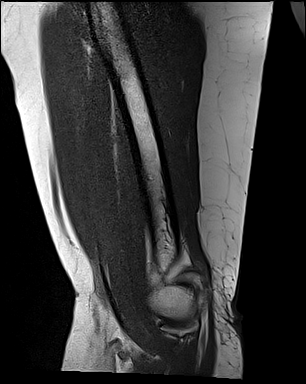
[im 22/22]
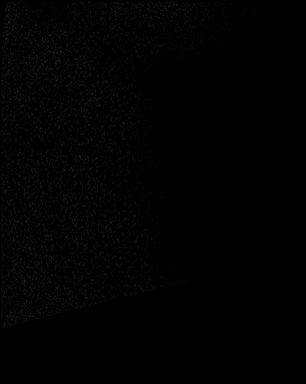

[Series 105: T2 · sagittal · 5.0mm · 0.39mm/px · 2 of 22 slices shown (2 of 2)]
[im 1/22]
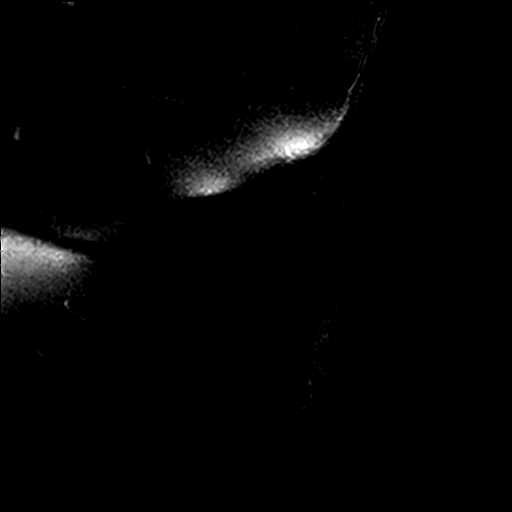
[im 11/22]
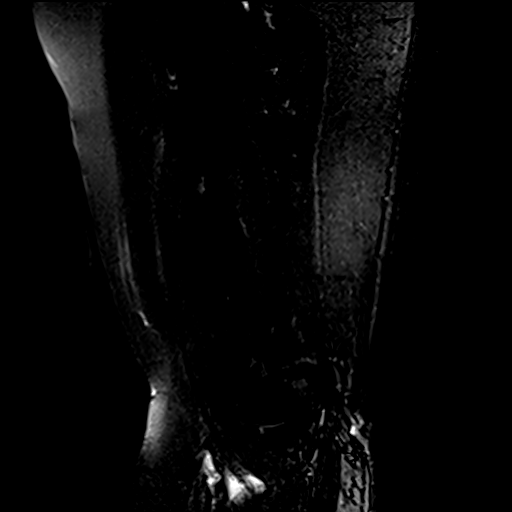

[Series 107: T1 · axial · 4.0mm · 0.62mm/px · z∈[-150,+11]mm · 5 of 35 slices shown (2 of 2)]
[im 1/35]
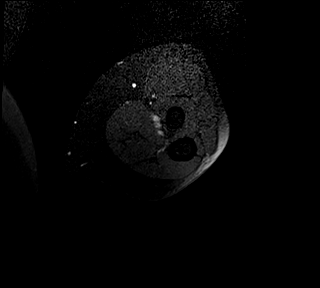
[im 9/35]
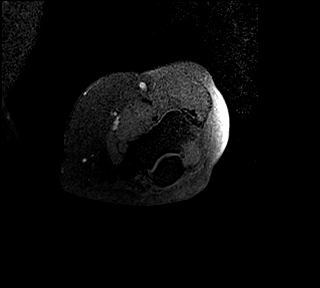
[im 18/35]
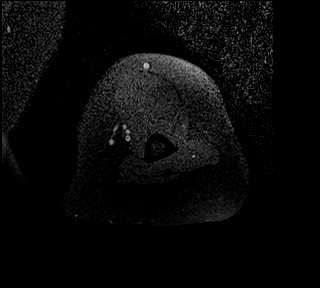
[im 26/35]
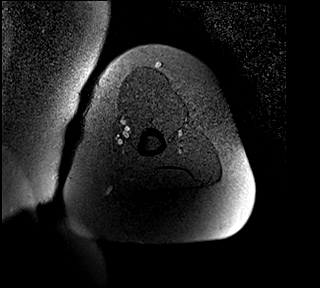
[im 35/35]
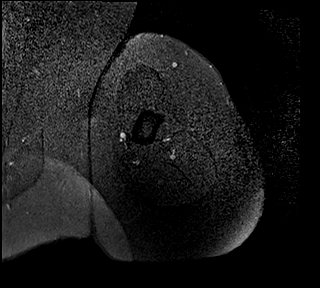

[Series 108: T1 fat-sat · axial · 4.0mm · 0.62mm/px · z∈[-150,+11]mm · 5 of 35 slices shown (2 of 2)]
[im 1/35]
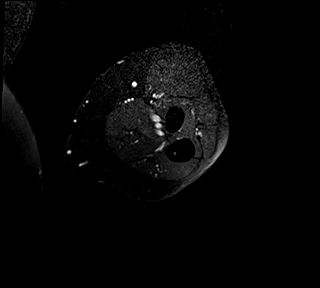
[im 9/35]
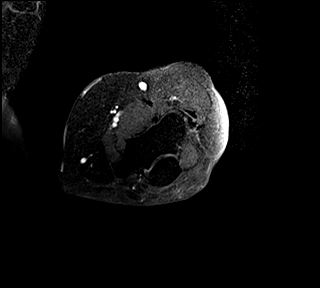
[im 18/35]
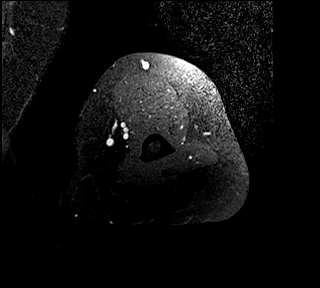
[im 26/35]
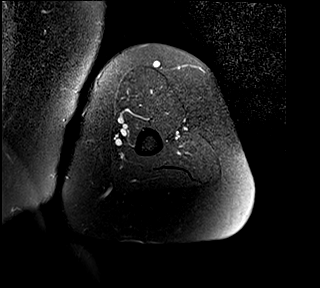
[im 35/35]
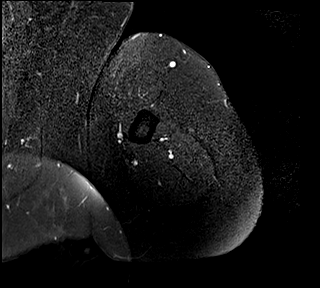

[35 of 40 positions shown; findings below may reference images not displayed]

FINDINGS: Markers are placed about the region of concern along the anterior
aspect of the left upper arm. No mass is identified. As seen on
ultrasound, there is a small amount of fluid anterior to the
radiocapitellar joint measuring 1.6 cm craniocaudal by 0.7 cm AP by
0.9 cm transverse which may represent a synovial or ganglion cyst.
The lesion does not enhance. No pathologic enhancement is
identified. Bone marrow signal is normal throughout.

Although this study is not designed to evaluate the intrinsic
structures of the elbow, the flexor and extensor tendon origins
appear normal. No ligament tear is identified. No evidence of
arthropathy is seen. Major neurovascular bundles are unremarkable.
All imaged muscles and tendons are intact and normal appearance.
IMPRESSION: Small, benign fluid collection anterior to the radiocapitellar joint
may be a synovial or ganglion cyst. The examination is otherwise
negative.

## 2017-10-30 ENCOUNTER — Emergency Department
Admission: EM | Admit: 2017-10-30 | Discharge: 2017-10-30 | Disposition: A | Discharge status: Home / Self Care | Payer: BLUE CROSS/BLUE SHIELD | Attending: Student in an Organized Health Care Education/Training Program | Admitting: Student in an Organized Health Care Education/Training Program

## 2017-10-30 ENCOUNTER — Emergency Department: Payer: BLUE CROSS/BLUE SHIELD

## 2017-10-30 ENCOUNTER — Encounter: Payer: Self-pay | Admitting: Emergency Medicine

## 2017-10-30 DIAGNOSIS — I10 Essential (primary) hypertension: Secondary | ICD-10-CM | POA: Insufficient documentation

## 2017-10-30 DIAGNOSIS — R05 Cough: Secondary | ICD-10-CM | POA: Insufficient documentation

## 2017-10-30 DIAGNOSIS — R059 Cough, unspecified: Secondary | ICD-10-CM

## 2017-10-30 DIAGNOSIS — Z7982 Long term (current) use of aspirin: Secondary | ICD-10-CM | POA: Insufficient documentation

## 2017-10-30 DIAGNOSIS — J40 Bronchitis, not specified as acute or chronic: Secondary | ICD-10-CM | POA: Insufficient documentation

## 2017-10-30 LAB — BASIC METABOLIC PANEL
Anion gap: 9 (ref 5–15)
BUN: 8 mg/dL (ref 6–20)
CO2: 25 mmol/L (ref 22–32)
CREATININE: 0.69 mg/dL (ref 0.44–1.00)
Calcium: 9.4 mg/dL (ref 8.9–10.3)
Chloride: 104 mmol/L (ref 101–111)
GFR calc Af Amer: >60 mL/min (ref >60)
GFR calc non Af Amer: >60 mL/min (ref >60)
GLUCOSE: 99 mg/dL (ref 65–99)
POTASSIUM: 3.8 mmol/L (ref 3.5–5.1)
Sodium: 138 mmol/L (ref 135–145)

## 2017-10-30 LAB — CBC
HEMATOCRIT: 40 % (ref 35.0–47.0)
Hemoglobin: 13.3 g/dL (ref 12.0–16.0)
MCH: 29.1 pg (ref 26.0–34.0)
MCHC: 33.4 g/dL (ref 32.0–36.0)
MCV: 87.2 fL (ref 80.0–100.0)
Platelets: 279 10*3/uL (ref 150–440)
RBC: 4.58 MIL/uL (ref 3.80–5.20)
RDW: 13.1 % (ref 11.5–14.5)
WBC: 6.9 10*3/uL (ref 3.6–11.0)

## 2017-10-30 LAB — TROPONIN I: Troponin I: <0.03 ng/mL (ref <0.03)

## 2017-10-30 MED ORDER — DOXYCYCLINE HYCLATE 100 MG PO TABS: 100.0000 mg | ORAL_TABLET | Freq: Two times a day (BID) | ORAL | 0 refills | Status: AC

## 2017-10-30 MED ORDER — PREDNISONE 20 MG PO TABS
60.0000 mg | ORAL_TABLET | Freq: Once | ORAL | Status: AC
  Administered 2017-10-30: 60 mg via ORAL
  Filled 2017-10-30: qty 3

## 2017-10-30 MED ORDER — BENZONATATE 100 MG PO CAPS: 100.0000 mg | ORAL_CAPSULE | Freq: Four times a day (QID) | ORAL | 0 refills | Status: AC | PRN

## 2017-10-30 MED ORDER — ALBUTEROL SULFATE HFA 108 (90 BASE) MCG/ACT IN AERS: 2.0000 | INHALATION_SPRAY | Freq: Four times a day (QID) | RESPIRATORY_TRACT | 2 refills | Status: DC | PRN

## 2017-10-30 MED ORDER — DOXYCYCLINE HYCLATE 100 MG PO TABS
100.0000 mg | ORAL_TABLET | Freq: Once | ORAL | Status: AC
  Administered 2017-10-30: 100 mg via ORAL
  Filled 2017-10-30: qty 1

## 2017-10-30 MED ORDER — IPRATROPIUM-ALBUTEROL 0.5-2.5 (3) MG/3ML IN SOLN
3.0000 mL | Freq: Once | RESPIRATORY_TRACT | Status: AC
  Administered 2017-10-30: 3 mL via RESPIRATORY_TRACT
  Filled 2017-10-30: qty 3

## 2017-10-30 MED ORDER — PREDNISONE 20 MG PO TABS: 40.0000 mg | ORAL_TABLET | Freq: Every day | ORAL | 0 refills | Status: AC

## 2017-10-30 NOTE — ED Provider Notes (Signed)
[facility] Emergency Department Provider Note    First MD Initiated Contact with Patient 10/30/17 1113     (approximate)  I have reviewed the triage vital signs and the nursing notes.   HISTORY  Chief Complaint Nasal Congestion and Cough    HPI Meagan Carr is a 62 y.o. female history of hypertension as well as bronchitis presents to the ER with chief complaint of several days with what initially started with nasal congestion but has gone down her chest causing productive cough and chest discomfort particularly when she has cough.  Also complaining of a headache that started after the cough.  Denies any rashes.  No measured fevers.  No recent antibiotics.  States the pain is also worse with movement and she is also very frustrated because she has not been able to sleep due to the cough.    Past Medical History:  Diagnosis Date  . Hypertension   . S/P colonoscopy 2008   Normal  . S/P colonoscopy with polypectomy 2001   [place], polyps, unsure what type   Family History  Problem Relation Age of Onset  . Breast cancer Mother        metastasized to bones, deceased age 98  . Brain cancer Father        deceased age 90  . Colon cancer Neg Hx    Past Surgical History:  Procedure Laterality Date  . ABDOMINAL HYSTERECTOMY    . APPENDECTOMY    . c-section     X2  . CHOLECYSTECTOMY     Patient Active Problem List   Diagnosis Date Noted  . TIA (transient ischemic attack) 12/11/2015  . Abdominal pain 09/13/2010  . Melena 09/13/2010      Prior to Admission medications   Medication Sig Start Date End Date Taking? Authorizing Provider  albuterol (PROVENTIL HFA;VENTOLIN HFA) 108 (90 Base) MCG/ACT inhaler Inhale 2 puffs into the lungs every 6 (six) hours as needed for wheezing or shortness of breath. 10/30/17   Willy Eddy, MD  aspirin 81 MG chewable tablet Chew 1 tablet (81 mg total) by mouth daily. 12/11/15   Shaune Pollack, MD  benzonatate  (TESSALON PERLES) 100 MG capsule Take 1 capsule (100 mg total) by mouth every 6 (six) hours as needed for cough. 10/30/17 10/30/18  Willy Eddy, MD  doxycycline (VIBRA-TABS) 100 MG tablet Take 1 tablet (100 mg total) by mouth 2 (two) times daily for 7 days. 10/30/17 11/06/17  Willy Eddy, MD  predniSONE (DELTASONE) 20 MG tablet Take 2 tablets (40 mg total) by mouth daily for 5 days. 10/30/17 11/04/17  Willy Eddy, MD  SUMAtriptan (IMITREX) 50 MG tablet Take 1 tablet (50 mg total) by mouth once as needed (max of 2 tabs per day). May repeat in 2 hours if headache persists or recurs. 01/18/16 01/18/17  Christella Scheuermann, PA-C    Allergies Patient has no known allergies.    Social History Social History   Tobacco Use  . Smoking status: Never Smoker  . Smokeless tobacco: Never Used  Substance Use Topics  . Alcohol use: No  . Drug use: No    Review of Systems Patient denies headaches, rhinorrhea, blurry vision, numbness, shortness of breath, chest pain, edema, cough, abdominal pain, nausea, vomiting, diarrhea, dysuria, fevers, rashes or hallucinations unless otherwise stated above in HPI. ____________________________________________   PHYSICAL EXAM:  VITAL SIGNS: Vitals:   10/30/17 1055  BP: (!) 152/100  Pulse: (!) 104  Resp: 18  Temp: 98.8  F (37.1 C)  SpO2: 97%    Constitutional: Alert and oriented.  Eyes: Conjunctivae are normal.  Head: Atraumatic. Nose: No congestion/rhinnorhea. Mouth/Throat: Mucous membranes are moist.   Neck: No stridor. Painless ROM.  Cardiovascular: Normal rate, regular rhythm. Grossly normal heart sounds.  Good peripheral circulation. Respiratory: Normal respiratory effort.  No retractions. Lung with coarse expiratory phase with bronchitic sounding cough Gastrointestinal: Soft and nontender. No distention. No abdominal bruits. No CVA tenderness. Genitourinary:  Musculoskeletal: No lower extremity tenderness nor edema.  No joint  effusions. Neurologic:  Normal speech and language. No gross focal neurologic deficits are appreciated. No facial droop Skin:  Skin is warm, dry and intact. No rash noted. Psychiatric: Mood and affect are normal. Speech and behavior are normal.  ____________________________________________   LABS (all labs ordered are listed, but only abnormal results are displayed)  Results for orders placed or performed during the hospital encounter of 10/30/17 (from the past 24 hour(s))  Basic metabolic panel     Status: None   Collection Time: 10/30/17 11:03 AM  Result Value Ref Range   Sodium 138 135 - 145 mmol/L   Potassium 3.8 3.5 - 5.1 mmol/L   Chloride 104 101 - 111 mmol/L   CO2 25 22 - 32 mmol/L   Glucose, Bld 99 65 - 99 mg/dL   BUN 8 6 - 20 mg/dL   Creatinine, Ser 1.61 0.44 - 1.00 mg/dL   Calcium 9.4 8.9 - 09.6 mg/dL   GFR calc non Af Amer >60 >60 mL/min   GFR calc Af Amer >60 >60 mL/min   Anion gap 9 5 - 15  CBC     Status: None   Collection Time: 10/30/17 11:03 AM  Result Value Ref Range   WBC 6.9 3.6 - 11.0 K/uL   RBC 4.58 3.80 - 5.20 MIL/uL   Hemoglobin 13.3 12.0 - 16.0 g/dL   HCT 04.5 40.9 - 81.1 %   MCV 87.2 80.0 - 100.0 fL   MCH 29.1 26.0 - 34.0 pg   MCHC 33.4 32.0 - 36.0 g/dL   RDW 91.4 78.2 - 95.6 %   Platelets 279 150 - 440 K/uL  Troponin I     Status: None   Collection Time: 10/30/17 11:03 AM  Result Value Ref Range   Troponin I <0.03 <0.03 ng/mL   ____________________________________________  EKG My review and personal interpretation at Time: 11:01   Indication: cough  Rate: 95  Rhythm: sinus Axis: normal Other: normal intervals, no stemi ____________________________________________  RADIOLOGY  I personally reviewed all radiographic images ordered to evaluate for the above acute complaints and reviewed radiology reports and findings.  These findings were personally discussed with the patient.  Please see medical record for radiology  report.  ____________________________________________   PROCEDURES  Procedure(s) performed:  Procedures    Critical Care performed: no ____________________________________________   INITIAL IMPRESSION / ASSESSMENT AND PLAN / ED COURSE  Pertinent labs & imaging results that were available during my care of the patient were reviewed by me and considered in my medical decision making (see chart for details).   DDX: pna, bronchitis, copd, pleurisy  Meagan Carr is a 62 y.o. who presents to the ED with symptoms as described above.  Patient with bronchitic sounding cough and based on her presentation most likely diagnosis.  Not clinically consistent with PE or ACS.  Abdominal exam soft and benign.  Patient will be given steroids as well as bronchodilators and course of doxycycline for her productive  cough.  Patient stable and appropriate for outpatient follow-up.      As part of my medical decision making, I reviewed the following data within the electronic MEDICAL RECORD NUMBER Nursing notes reviewed and incorporated, Labs reviewed, notes from prior ED visits.   ____________________________________________   FINAL CLINICAL IMPRESSION(S) / ED DIAGNOSES  Final diagnoses:  Cough  Bronchitis      NEW MEDICATIONS STARTED DURING THIS VISIT:  New Prescriptions   ALBUTEROL (PROVENTIL HFA;VENTOLIN HFA) 108 (90 BASE) MCG/ACT INHALER    Inhale 2 puffs into the lungs every 6 (six) hours as needed for wheezing or shortness of breath.   BENZONATATE (TESSALON PERLES) 100 MG CAPSULE    Take 1 capsule (100 mg total) by mouth every 6 (six) hours as needed for cough.   DOXYCYCLINE (VIBRA-TABS) 100 MG TABLET    Take 1 tablet (100 mg total) by mouth 2 (two) times daily for 7 days.   PREDNISONE (DELTASONE) 20 MG TABLET    Take 2 tablets (40 mg total) by mouth daily for 5 days.     Note:  This document was prepared using Dragon voice recognition software and may include unintentional dictation  errors.    Willy Eddyobinson, Daviyon Widmayer, MD 10/30/17 1151

## 2017-10-30 NOTE — ED Notes (Signed)
Pt verbalizes understanding of d/c instructions, medications and follow up 

## 2017-10-30 NOTE — ED Triage Notes (Signed)
Patient presents to the ED with cough and congestion with rib pain, chest soreness from cough.  Patient states, "I just can't stop coughing."  Patient denies fever.  Patient has dry hacking cough in triage.

## 2017-10-30 NOTE — ED Notes (Signed)
Pt resting in bed, NAD, friend at bedside. 

## 2018-12-12 ENCOUNTER — Other Ambulatory Visit: Payer: Self-pay

## 2018-12-12 ENCOUNTER — Emergency Department: Payer: BLUE CROSS/BLUE SHIELD

## 2018-12-12 ENCOUNTER — Emergency Department
Admission: EM | Admit: 2018-12-12 | Discharge: 2018-12-12 | Disposition: A | Discharge status: Home / Self Care | Payer: BLUE CROSS/BLUE SHIELD | Attending: Emergency Medicine | Admitting: Emergency Medicine

## 2018-12-12 DIAGNOSIS — M79602 Pain in left arm: Secondary | ICD-10-CM | POA: Diagnosis not present

## 2018-12-12 DIAGNOSIS — R202 Paresthesia of skin: Secondary | ICD-10-CM | POA: Insufficient documentation

## 2018-12-12 DIAGNOSIS — M542 Cervicalgia: Secondary | ICD-10-CM | POA: Insufficient documentation

## 2018-12-12 DIAGNOSIS — Z8673 Personal history of transient ischemic attack (TIA), and cerebral infarction without residual deficits: Secondary | ICD-10-CM | POA: Diagnosis not present

## 2018-12-12 DIAGNOSIS — I10 Essential (primary) hypertension: Secondary | ICD-10-CM | POA: Diagnosis not present

## 2018-12-12 DIAGNOSIS — R110 Nausea: Secondary | ICD-10-CM | POA: Diagnosis not present

## 2018-12-12 DIAGNOSIS — M5412 Radiculopathy, cervical region: Secondary | ICD-10-CM

## 2018-12-12 LAB — CBC
HCT: 39.2 % (ref 36.0–46.0)
Hemoglobin: 12.7 g/dL (ref 12.0–15.0)
MCH: 29 pg (ref 26.0–34.0)
MCHC: 32.4 g/dL (ref 30.0–36.0)
MCV: 89.5 fL (ref 80.0–100.0)
Platelets: 248 10*3/uL (ref 150–400)
RBC: 4.38 MIL/uL (ref 3.87–5.11)
RDW: 12.4 % (ref 11.5–15.5)
WBC: 6.4 10*3/uL (ref 4.0–10.5)
nRBC: 0 % (ref 0.0–0.2)

## 2018-12-12 LAB — BASIC METABOLIC PANEL
Anion gap: 6 (ref 5–15)
BUN: 14 mg/dL (ref 8–23)
CO2: 27 mmol/L (ref 22–32)
Calcium: 9.3 mg/dL (ref 8.9–10.3)
Chloride: 106 mmol/L (ref 98–111)
Creatinine, Ser: 0.65 mg/dL (ref 0.44–1.00)
GFR calc Af Amer: >60 mL/min (ref >60)
GFR calc non Af Amer: >60 mL/min (ref >60)
Glucose, Bld: 118 mg/dL — ABNORMAL HIGH (ref 70–99)
Potassium: 3.9 mmol/L (ref 3.5–5.1)
Sodium: 139 mmol/L (ref 135–145)

## 2018-12-12 LAB — TROPONIN I (HIGH SENSITIVITY): Troponin I (High Sensitivity): 8 ng/L (ref <18)

## 2018-12-12 MED ORDER — PREDNISONE 10 MG (21) PO TBPK: ORAL_TABLET | ORAL | 0 refills | Status: DC

## 2018-12-12 MED ORDER — GABAPENTIN 300 MG PO CAPS
300.0000 mg | ORAL_CAPSULE | Freq: Once | ORAL | Status: AC
  Administered 2018-12-12: 17:00:00 300 mg via ORAL
  Filled 2018-12-12: qty 1

## 2018-12-12 MED ORDER — GABAPENTIN 300 MG PO CAPS: 300.0000 mg | ORAL_CAPSULE | Freq: Three times a day (TID) | ORAL | 0 refills | Status: AC

## 2018-12-12 NOTE — ED Notes (Signed)
Pt c/o constant pain in left arm/shoulder for several days that has gotten worse - she states she has decreased strength and grip in the left arm - she denies injury - she reports episodes of "hot flashes" - denies shortness of breath

## 2018-12-12 NOTE — Discharge Instructions (Signed)
Please seek medical attention for any high fevers, chest pain, shortness of breath, change in behavior, persistent vomiting, bloody stool or any other new or concerning symptoms.  

## 2018-12-12 NOTE — ED Notes (Signed)
Patient transported to Ultrasound 

## 2018-12-12 NOTE — ED Triage Notes (Signed)
Patient presents to the ED with left arm pain x 4 days.  Patient states pain started with left jaw/neck pain and has radiated into left shoulder and down left arm.  Patient also reports pain to center of chest.  Patient states, "I just thought it was acid reflux, but now it feels like a toothache in my arm."

## 2018-12-12 NOTE — ED Provider Notes (Signed)
[facility] Emergency Department Provider Note  ____________________________________________   I have reviewed the triage vital signs and the nursing notes.   HISTORY  Chief Complaint Chest Pain   History limited by: Not Limited   HPI Meagan Carr is a 63 y.o. female who presents to the emergency department today because of concerns for left arm and left neck pain.  The patient symptoms started roughly 4 days ago.  Initially the pain was located in the left side of her neck.  It is now more in her left arm.  There is some throbbing and tingling.  The pain has been constant.  Nothing she has done has made the pain better.  She does notice that movement makes it worse. She has had some associated nausea. She denies any new shortness of breath. Denies any fever. Denies any trauma or unusual activity prior to the pain starting.    Records reviewed. Per medical record review patient has a history of HTN.   Past Medical History:  Diagnosis Date  . Hypertension   . S/P colonoscopy 2008   Normal  . S/P colonoscopy with polypectomy 2001   [place], polyps, unsure what type    Patient Active Problem List   Diagnosis Date Noted  . TIA (transient ischemic attack) 12/11/2015  . Abdominal pain 09/13/2010  . Melena 09/13/2010    Past Surgical History:  Procedure Laterality Date  . ABDOMINAL HYSTERECTOMY    . APPENDECTOMY    . c-section     X2  . CHOLECYSTECTOMY      Prior to Admission medications   Medication Sig Start Date End Date Taking? Authorizing Provider  albuterol (PROVENTIL HFA;VENTOLIN HFA) 108 (90 Base) MCG/ACT inhaler Inhale 2 puffs into the lungs every 6 (six) hours as needed for wheezing or shortness of breath. 10/30/17   Willy Eddyobinson, Patrick, MD  aspirin 81 MG chewable tablet Chew 1 tablet (81 mg total) by mouth daily. 12/11/15   Shaune Pollackhen, Qing, MD  SUMAtriptan (IMITREX) 50 MG tablet Take 1 tablet (50 mg total) by mouth once as needed (max of 2 tabs  per day). May repeat in 2 hours if headache persists or recurs. 01/18/16 01/18/17  Christella ScheuermannLawrence, Emma V, PA-C    Allergies Patient has no known allergies.  Family History  Problem Relation Age of Onset  . Breast cancer Mother        metastasized to bones, deceased age 656  . Brain cancer Father        deceased age 63  . Colon cancer Neg Hx     Social History Social History   Tobacco Use  . Smoking status: Never Smoker  . Smokeless tobacco: Never Used  Substance Use Topics  . Alcohol use: No  . Drug use: No    Review of Systems Constitutional: No fever/chills Eyes: No visual changes. ENT: No sore throat. Cardiovascular: Denies chest pain. Respiratory: Denies shortness of breath. Gastrointestinal: No abdominal pain.  Positive for nausea.   Genitourinary: Negative for dysuria. Musculoskeletal: Positive for left neck pain, left arm pain and tingling.  Skin: Negative for rash. Neurological: Negative for headaches, focal weakness or numbness.  ____________________________________________   PHYSICAL EXAM:  VITAL SIGNS: ED Triage Vitals  Enc Vitals Group     BP 12/12/18 1517 (!) 163/91     Pulse Rate 12/12/18 1517 88     Resp 12/12/18 1517 18     Temp 12/12/18 1516 99.1 F (37.3 C)     Temp Source 12/12/18  1516 Oral     SpO2 12/12/18 1517 97 %     Weight 12/12/18 1517 218 lb (98.9 kg)     Height 12/12/18 1517 5\' 3"  (1.6 m)     Head Circumference --      Peak Flow --      Pain Score 12/12/18 1517 10   Constitutional: Alert and oriented.  Eyes: Conjunctivae are normal.  ENT      Head: Normocephalic and atraumatic.      Nose: No congestion/rhinnorhea.      Mouth/Throat: Mucous membranes are moist.      Neck: No stridor. Hematological/Lymphatic/Immunilogical: No cervical lymphadenopathy. Cardiovascular: Normal rate, regular rhythm.  No murmurs, rubs, or gallops.  Respiratory: Normal respiratory effort without tachypnea nor retractions. Breath sounds are clear and  equal bilaterally. No wheezes/rales/rhonchi. Gastrointestinal: Soft and non tender. No rebound. No guarding.  Genitourinary: Deferred Musculoskeletal: Normal range of motion in all extremities. Tender to palpation over the left trapezius. No swelling of left arm. No discoloration. Radial pulse 2+ Neurologic:  Normal speech and language. No gross focal neurologic deficits are appreciated.  Skin:  Skin is warm, dry and intact. No rash noted. Psychiatric: Mood and affect are normal. Speech and behavior are normal. Patient exhibits appropriate insight and judgment.  ____________________________________________    LABS (pertinent positives/negatives)  CBC wbc 6.4, hgb 12.7, plt 248 Trop hs 8 BMP wnl except glu 118 ____________________________________________   EKG  I, Nance Pear, attending physician, personally viewed and interpreted this EKG  EKG Time: 1526 Rate: 81 Rhythm: normal sinus rhythm Axis: normal Intervals: qtc 425 QRS: narrow, q waves v1 ST changes: no st elevation Impression: abnormal ekg  ____________________________________________    RADIOLOGY  CXR No acute abnormality  [place] LUE No DVT ____________________________________________   PROCEDURES  Procedures  ____________________________________________   INITIAL IMPRESSION / ASSESSMENT AND PLAN / ED COURSE  Pertinent labs & imaging results that were available during my care of the patient were reviewed by me and considered in my medical decision making (see chart for details).   Patient presented to the emergency department today with concern for left arm and neck pain. Work up without elevated troponin or leukocytosis.  Patient did have some concerns for possible blood clot.  Although clinical exam was not overly suggestive of it she stated she did have family member who had a blood clot.  Ultrasound was performed which did not show a acute DVT.  Good radial pulse and no discoloration, I doubt  arterial clot at this time.  Given description of pain coming from neck as well I do think patient likely suffering from cervical radiculopathy.  She did feel somewhat better after gabapentin.  Will plan on discharging with prescription for gabapentin and steroids.  Discussed plan and findings with patient.   ____________________________________________   FINAL CLINICAL IMPRESSION(S) / ED DIAGNOSES  Final diagnoses:  Left arm pain  Cervical radiculopathy     Note: This dictation was prepared with Dragon dictation. Any transcriptional errors that result from this process are unintentional     Nance Pear, MD 12/12/18 1946

## 2018-12-12 NOTE — ED Notes (Signed)
Patient transported to X-ray 

## 2019-01-14 ENCOUNTER — Ambulatory Visit: Payer: Self-pay

## 2019-01-14 NOTE — Telephone Encounter (Signed)
Incoming call from Patient with a complaint of swelling tongue, and hives Reports pain on the left side or arms.  Reports having arthritis in left side.onset this morning around 2'o.clock.  Denies difficulty breathing. Has taking Ibuprofen, feels like tongue is starting to swell again. Reviewed Protocol.   Recommend Patient go to ED or Urgent care.  Voiced understanding.    Reason for Disposition . All other adults with swollen tongue   (Exception: tongue swelling is a recurrent problem AND NO swelling at present)  Answer Assessment - Initial Assessment Questions 1. ONSET: "When did the swelling start?" (e.g., minutes, hours, days)     This. morning about 2 oclock 2. LOCATION: "What part of the tongue is swollen?"     *tongue 3. SEVERITY: "How swollen is it?"    Mildly fell likes it is selling  4. CAUSE: "What do you think is causing the tongue swelling?" (e.g., hx of angioedema, allergies)     5. RECURRENT SYMPTOM: "Have you had tongue swelling before?" If so, ask: "When was the last time?" "What happened that time?"     *No Answer* 6. OTHER SYMPTOMS: "Do you have any other symptoms?" (e.g., difficulty breathing, facial swelling)     Denies difficulty swelling hvies on leg.  7. PREGNANCY: "Is there any chance you are pregnant?" "When was your last menstrual period?"     na  Protocols used: TONGUE SWELLING-A-AH

## 2019-01-14 NOTE — Telephone Encounter (Signed)
Did not see that this is a patient of [facility].Sorry

## 2020-08-12 DIAGNOSIS — L989 Disorder of the skin and subcutaneous tissue, unspecified: Secondary | ICD-10-CM | POA: Insufficient documentation

## 2020-08-12 DIAGNOSIS — R0600 Dyspnea, unspecified: Secondary | ICD-10-CM | POA: Insufficient documentation

## 2020-08-12 DIAGNOSIS — L040 Acute lymphadenitis of face, head and neck: Secondary | ICD-10-CM | POA: Insufficient documentation

## 2020-08-12 DIAGNOSIS — M5412 Radiculopathy, cervical region: Secondary | ICD-10-CM | POA: Insufficient documentation

## 2020-08-12 DIAGNOSIS — J329 Chronic sinusitis, unspecified: Secondary | ICD-10-CM | POA: Insufficient documentation

## 2020-08-12 DIAGNOSIS — R202 Paresthesia of skin: Secondary | ICD-10-CM | POA: Insufficient documentation

## 2020-08-12 DIAGNOSIS — J189 Pneumonia, unspecified organism: Secondary | ICD-10-CM | POA: Insufficient documentation

## 2020-08-12 DIAGNOSIS — R9389 Abnormal findings on diagnostic imaging of other specified body structures: Secondary | ICD-10-CM | POA: Insufficient documentation

## 2020-08-12 DIAGNOSIS — H539 Unspecified visual disturbance: Secondary | ICD-10-CM | POA: Insufficient documentation

## 2020-08-12 DIAGNOSIS — K760 Fatty (change of) liver, not elsewhere classified: Secondary | ICD-10-CM | POA: Insufficient documentation

## 2020-08-12 DIAGNOSIS — R7303 Prediabetes: Secondary | ICD-10-CM | POA: Insufficient documentation

## 2020-08-12 DIAGNOSIS — K5900 Constipation, unspecified: Secondary | ICD-10-CM | POA: Insufficient documentation

## 2020-08-12 DIAGNOSIS — H538 Other visual disturbances: Secondary | ICD-10-CM | POA: Insufficient documentation

## 2020-08-12 DIAGNOSIS — K219 Gastro-esophageal reflux disease without esophagitis: Secondary | ICD-10-CM | POA: Insufficient documentation

## 2020-08-12 DIAGNOSIS — M7670 Peroneal tendinitis, unspecified leg: Secondary | ICD-10-CM | POA: Insufficient documentation

## 2020-08-12 DIAGNOSIS — R030 Elevated blood-pressure reading, without diagnosis of hypertension: Secondary | ICD-10-CM | POA: Insufficient documentation

## 2020-08-26 ENCOUNTER — Other Ambulatory Visit: Payer: Self-pay

## 2020-08-26 ENCOUNTER — Emergency Department
Admission: EM | Admit: 2020-08-26 | Discharge: 2020-08-26 | Disposition: A | Discharge status: Home / Self Care | Payer: Medicare Other | Attending: Emergency Medicine | Admitting: Emergency Medicine

## 2020-08-26 ENCOUNTER — Emergency Department: Payer: Medicare Other

## 2020-08-26 DIAGNOSIS — R531 Weakness: Secondary | ICD-10-CM | POA: Diagnosis not present

## 2020-08-26 DIAGNOSIS — Z91040 Latex allergy status: Secondary | ICD-10-CM | POA: Diagnosis not present

## 2020-08-26 DIAGNOSIS — M5412 Radiculopathy, cervical region: Secondary | ICD-10-CM | POA: Diagnosis not present

## 2020-08-26 DIAGNOSIS — I10 Essential (primary) hypertension: Secondary | ICD-10-CM | POA: Insufficient documentation

## 2020-08-26 DIAGNOSIS — R519 Headache, unspecified: Secondary | ICD-10-CM | POA: Diagnosis not present

## 2020-08-26 DIAGNOSIS — Z20822 Contact with and (suspected) exposure to covid-19: Secondary | ICD-10-CM | POA: Insufficient documentation

## 2020-08-26 DIAGNOSIS — R202 Paresthesia of skin: Secondary | ICD-10-CM | POA: Diagnosis present

## 2020-08-26 DIAGNOSIS — Z8673 Personal history of transient ischemic attack (TIA), and cerebral infarction without residual deficits: Secondary | ICD-10-CM | POA: Diagnosis not present

## 2020-08-26 LAB — CBC WITH DIFFERENTIAL/PLATELET
Abs Immature Granulocytes: 0 10*3/uL (ref 0.00–0.07)
Basophils Absolute: 0 10*3/uL (ref 0.0–0.1)
Basophils Relative: 1 %
Eosinophils Absolute: 0.1 10*3/uL (ref 0.0–0.5)
Eosinophils Relative: 2 %
HCT: 42.3 % (ref 36.0–46.0)
Hemoglobin: 13.9 g/dL (ref 12.0–15.0)
Immature Granulocytes: 0 %
Lymphocytes Relative: 50 %
Lymphs Abs: 2 10*3/uL (ref 0.7–4.0)
MCH: 29.3 pg (ref 26.0–34.0)
MCHC: 32.9 g/dL (ref 30.0–36.0)
MCV: 89.2 fL (ref 80.0–100.0)
Monocytes Absolute: 0.3 10*3/uL (ref 0.1–1.0)
Monocytes Relative: 8 %
Neutro Abs: 1.6 10*3/uL — ABNORMAL LOW (ref 1.7–7.7)
Neutrophils Relative %: 39 %
Platelets: 250 10*3/uL (ref 150–400)
RBC: 4.74 MIL/uL (ref 3.87–5.11)
RDW: 12.5 % (ref 11.5–15.5)
WBC: 4 10*3/uL (ref 4.0–10.5)
nRBC: 0 % (ref 0.0–0.2)

## 2020-08-26 LAB — COMPREHENSIVE METABOLIC PANEL
ALT: 31 U/L (ref 0–44)
AST: 26 U/L (ref 15–41)
Albumin: 3.9 g/dL (ref 3.5–5.0)
Alkaline Phosphatase: 90 U/L (ref 38–126)
Anion gap: 10 (ref 5–15)
BUN: 9 mg/dL (ref 8–23)
CO2: 24 mmol/L (ref 22–32)
Calcium: 9.3 mg/dL (ref 8.9–10.3)
Chloride: 105 mmol/L (ref 98–111)
Creatinine, Ser: 0.74 mg/dL (ref 0.44–1.00)
GFR, Estimated: >60 mL/min (ref >60)
Glucose, Bld: 95 mg/dL (ref 70–99)
Potassium: 4 mmol/L (ref 3.5–5.1)
Sodium: 139 mmol/L (ref 135–145)
Total Bilirubin: 0.8 mg/dL (ref 0.3–1.2)
Total Protein: 7.6 g/dL (ref 6.5–8.1)

## 2020-08-26 LAB — RESP PANEL BY RT-PCR (FLU A&B, COVID) ARPGX2
Influenza A by PCR: NEGATIVE
Influenza B by PCR: NEGATIVE
SARS Coronavirus 2 by RT PCR: NEGATIVE

## 2020-08-26 LAB — PROTIME-INR
INR: 1.1 (ref 0.8–1.2)
Prothrombin Time: 13.6 seconds (ref 11.4–15.2)

## 2020-08-26 LAB — T4, FREE: Free T4: 0.83 ng/dL (ref 0.61–1.12)

## 2020-08-26 LAB — VITAMIN B12: Vitamin B-12: 298 pg/mL (ref 180–914)

## 2020-08-26 LAB — TSH: TSH: 2.236 u[IU]/mL (ref 0.350–4.500)

## 2020-08-26 LAB — CBG MONITORING, ED: Glucose-Capillary: 98 mg/dL (ref 70–99)

## 2020-08-26 LAB — APTT: aPTT: 26 seconds (ref 24–36)

## 2020-08-26 MED ORDER — METOCLOPRAMIDE HCL 5 MG/ML IJ SOLN
10.0000 mg | Freq: Once | INTRAMUSCULAR | Status: AC
  Administered 2020-08-26: 10 mg via INTRAVENOUS
  Filled 2020-08-26: qty 2

## 2020-08-26 MED ORDER — SODIUM CHLORIDE 0.9% FLUSH
3.0000 mL | Freq: Once | INTRAVENOUS | Status: AC
  Administered 2020-08-26: 3 mL via INTRAVENOUS

## 2020-08-26 MED ORDER — DIPHENHYDRAMINE HCL 50 MG/ML IJ SOLN
12.5000 mg | Freq: Once | INTRAMUSCULAR | Status: AC
  Administered 2020-08-26: 12.5 mg via INTRAVENOUS
  Filled 2020-08-26: qty 1

## 2020-08-26 MED ORDER — ACETAMINOPHEN 500 MG PO TABS
1000.0000 mg | ORAL_TABLET | Freq: Once | ORAL | Status: DC
  Filled 2020-08-26: qty 2

## 2020-08-26 MED ORDER — GADOBUTROL 1 MMOL/ML IV SOLN
9.0000 mL | Freq: Once | INTRAVENOUS | Status: AC | PRN
  Administered 2020-08-26: 9 mL via INTRAVENOUS

## 2020-08-26 NOTE — ED Notes (Signed)
Pt in MRI.

## 2020-08-26 NOTE — ED Notes (Signed)
ED Provider at bedside. 

## 2020-08-26 NOTE — ED Notes (Signed)
Report received from [facility] regarding pt

## 2020-08-26 NOTE — ED Triage Notes (Signed)
Pt with c/o numbness and tingling on left side starting at midnight. Pt with c/o headache and light headedness as well. Pt speaking normally, alert and oriented x4, NAD at this time. Pt ambulating with normal gait.

## 2020-08-26 NOTE — ED Provider Notes (Signed)
-----------------------------------------   6:05 PM on 08/26/2020 -----------------------------------------  I took over care of this patient from Dr. Fuller Plan.  MRI of the cervical spine shows some foraminal narrowing with no significant acute findings.  Headache is improved with migraine cocktail.  At this time, the patient is comfortable appearing.  I counseled her on the results of the work-up.  She is stable for discharge home.  Return precautions given, and she expresses understanding.  Neurosurgery follow-up provided.   Dionne Bucy, MD 08/26/20 1806

## 2020-08-26 NOTE — Discharge Instructions (Addendum)
Return to the ER for new, worsening, or persistent severe weakness or numbness, headache, vision changes, or any other new or worsening symptoms that concern you.

## 2020-08-26 NOTE — ED Notes (Signed)
Nurse at bedside obtaining blood

## 2020-08-26 NOTE — ED Notes (Signed)
On phone with MRI for screening questions.

## 2020-08-26 NOTE — ED Notes (Signed)
Code stroke not deselected from standing order set with initial order.  No code stroke for this patient.
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
## 2020-08-26 NOTE — ED Provider Notes (Signed)
[facility] Emergency Department Provider Note  ____________________________________________   Event Date/Time   First MD Initiated Contact with Patient 08/26/20 1024     (approximate)  I have reviewed the triage vital signs and the nursing notes.   HISTORY  Chief Complaint Numbness    HPI Meagan Carr is a 65 y.o. female with hypertension who comes in with numbness and tingling of the left side.  Patient reports that she was awake laying in bed when she had sudden onset of left-sided leg tingling.  She states that it felt like it fell asleep.  She states that she then had some of her left arm as well as her left face.  She does report a mild headache as well.  Her symptoms have been constant, nothing makes it better, nothing makes it worse.  Denies any chest pain or shortness of breath at this time.  Denies this ever happening previously.  To note on review of records patient did have CT with contrast done yesterday that shows some groundglass opacities concerning for infection versus inflammation.  Also had enlarged thyroid nodule that she supposed to get outpatient ultrasound for.  She also had an ultrasound done of her liver that shows hepatic steatosis but already has had her gallbladder removed.        Past Medical History:  Diagnosis Date  . Hypertension   . S/P colonoscopy 2008   Normal  . S/P colonoscopy with polypectomy 2001   [place], polyps, unsure what type    Patient Active Problem List   Diagnosis Date Noted  . TIA (transient ischemic attack) 12/11/2015  . Abdominal pain 09/13/2010  . Melena 09/13/2010    Past Surgical History:  Procedure Laterality Date  . ABDOMINAL HYSTERECTOMY    . APPENDECTOMY    . c-section     X2  . CHOLECYSTECTOMY      Prior to Admission medications   Medication Sig Start Date End Date Taking? Authorizing Provider  albuterol (PROVENTIL HFA;VENTOLIN HFA) 108 (90 Base) MCG/ACT inhaler Inhale 2  puffs into the lungs every 6 (six) hours as needed for wheezing or shortness of breath. 10/30/17   Willy Eddy, MD  gabapentin (NEURONTIN) 300 MG capsule Take 1 capsule (300 mg total) by mouth 3 (three) times daily. 12/12/18 12/12/19  Phineas Semen, MD  ibuprofen (ADVIL) 200 MG tablet Take 200 mg by mouth every 6 (six) hours as needed.    [provider]  omeprazole (PRILOSEC) 20 MG capsule Take 20 mg by mouth daily. 11/28/18   [provider]  predniSONE (STERAPRED UNI-PAK 21 TAB) 10 MG (21) TBPK tablet Per packaging instructions 12/12/18   Phineas Semen, MD    Allergies Latex  Family History  Problem Relation Age of Onset  . Breast cancer Mother        metastasized to bones, deceased age 39  . Brain cancer Father        deceased age 41  . Colon cancer Neg Hx     Social History Social History   Tobacco Use  . Smoking status: Never Smoker  . Smokeless tobacco: Never Used  Substance Use Topics  . Alcohol use: No  . Drug use: No      Review of Systems Constitutional: No fever/chills Eyes: No visual changes. ENT: No sore throat. Cardiovascular: Denies chest pain. Respiratory: Denies shortness of breath. Gastrointestinal: No abdominal pain.  No nausea, no vomiting.  No diarrhea.  No constipation. Genitourinary: Negative for dysuria. Musculoskeletal:  Negative for back pain. Skin: Negative for rash. Neurological: Positive left-sided tingling All other ROS negative ____________________________________________   PHYSICAL EXAM:  VITAL SIGNS: ED Triage Vitals  Enc Vitals Group     BP 08/26/20 1021 138/86     Pulse Rate 08/26/20 1021 73     Resp 08/26/20 1021 18     Temp 08/26/20 1021 98 F (36.7 C)     Temp Source 08/26/20 1021 Oral     SpO2 08/26/20 1021 97 %     Weight 08/26/20 1015 211 lb (95.7 kg)     Height 08/26/20 1015 5\' 3"  (1.6 m)     Head Circumference --      Peak Flow --      Pain Score 08/26/20 1015 3     Pain Loc --      Pain  Edu? --      Excl. in GC? --     Constitutional: Alert and oriented. Well appearing and in no acute distress. Eyes: Conjunctivae are normal. EOMI. Head: Atraumatic. Nose: No congestion/rhinnorhea. Mouth/Throat: Mucous membranes are moist.   Neck: No stridor. Trachea Midline. FROM Cardiovascular: Normal rate, regular rhythm. Grossly normal heart sounds.  Good peripheral circulation. Respiratory: Normal respiratory effort.  No retractions. Lungs CTAB. Gastrointestinal: Soft and nontender. No distention. No abdominal bruits.  Musculoskeletal: No lower extremity tenderness nor edema.  No joint effusions. Neurologic:  Normal speech and language.  Cranial 2 through 12 are intact.  Some very mild sensation changes on the left side of her body with may be very slight grip strength weakness on the left hand but no evidence of pronator drift Skin:  Skin is warm, dry and intact. No rash noted. Psychiatric: Mood and affect are normal. Speech and behavior are normal. GU: Deferred   ____________________________________________   LABS (all labs ordered are listed, but only abnormal results are displayed)  Labs Reviewed  CBC WITH DIFFERENTIAL/PLATELET - Abnormal; Notable for the following components:      Result Value   Neutro Abs 1.6 (*)    All other components within normal limits  RESP PANEL BY RT-PCR (FLU A&B, COVID) ARPGX2  COMPREHENSIVE METABOLIC PANEL  PROTIME-INR  APTT  TSH  T4, FREE  VITAMIN B12  CBG MONITORING, ED  CBG MONITORING, ED   ____________________________________________   ED ECG REPORT I, 08/28/20, the attending physician, personally viewed and interpreted this ECG.  Normal sinus rate of 80, no ST elevation T wave inversion lead III and a lot of T wave flattening, normal intervals ____________________________________________  RADIOLOGY   Official radiology report(s): CT HEAD WO CONTRAST  Result Date: 08/26/2020 CLINICAL DATA:  Left-sided numbness and  tingling.  Headache. EXAM: CT HEAD WITHOUT CONTRAST TECHNIQUE: Contiguous axial images were obtained from the base of the skull through the vertex without intravenous contrast. COMPARISON:  CT head dated January 18, 2016. FINDINGS: Brain: No evidence of acute infarction, hemorrhage, hydrocephalus, extra-axial collection or mass lesion/mass effect. Vascular: No hyperdense vessel or unexpected calcification. Skull: Normal. Negative for fracture or focal lesion. Sinuses/Orbits: No acute finding. Other: None. IMPRESSION: 1. Normal noncontrast head CT. Electronically Signed   By: January 20, 2016 M.D.   On: 08/26/2020 11:07   MR BRAIN WO CONTRAST  Result Date: 08/26/2020 CLINICAL DATA:  Left-sided numbness and tingling EXAM: MRI HEAD WITHOUT CONTRAST TECHNIQUE: Multiplanar, multiecho pulse sequences of the brain and surrounding structures were obtained without intravenous contrast. COMPARISON:  July 2017 FINDINGS: Brain: There is no acute infarction or intracranial  hemorrhage. There is no intracranial mass, mass effect, or edema. There is no hydrocephalus or extra-axial fluid collection. Ventricles and sulci are within normal limits in size and configuration. Vascular: Major vessel flow voids at the skull base are preserved. Skull and upper cervical spine: Normal marrow signal is preserved. Sinuses/Orbits: Paranasal sinuses are aerated. Orbits are unremarkable. Other: Sella is unremarkable.  Mastoid air cells are clear. IMPRESSION: No evidence of recent infarction, hemorrhage, or mass. Electronically Signed   By: Guadlupe Spanish M.D.   On: 08/26/2020 13:29    ____________________________________________   PROCEDURES  Procedure(s) performed (including Critical Care):  .1-3 Lead EKG Interpretation Performed by: Concha Se, MD Authorized by: Concha Se, MD     Interpretation: normal     ECG rate:  60s   ECG rate assessment: normal     Rhythm: sinus rhythm     Ectopy: none     Conduction: normal        ____________________________________________   INITIAL IMPRESSION / ASSESSMENT AND PLAN / ED COURSE  Meagan Carr was evaluated in Emergency Department on 08/26/2020 for the symptoms described in the history of present illness. She was evaluated in the context of the global COVID-19 pandemic, which necessitated consideration that the patient might be at risk for infection with the SARS-CoV-2 virus that causes COVID-19. Institutional protocols and algorithms that pertain to the evaluation of patients at risk for COVID-19 are in a state of rapid change based on information released by regulatory bodies including the CDC and federal and state organizations. These policies and algorithms were followed during the patient's care in the ED.    Patient is a 65 year old who comes in with left-sided tingling on her arm leg and face.  No chest pain or shortness of breath to suggest dissection. Pulses equal throughout.  Her neuro exam has a  stroke scale of 1.  CT head was ordered to evaluate for intercranial hemorrhage and labs ordered to evaluate for Electra abnormalities, AKI.  Will discuss with neurology given symptoms have been less than 24 hours. However lower suspicion for LVO. Denies recent trauma to suggest cervical issue.   CT head was negative.  3:03 PM discussed with neurology who recommended MRI cervical with and without contrast and to follow-up with neurosurgery outpatient if MRI does not show any acute concerns. See there note for more details. On review of records pt has history of cervical radiculopathy. Therefore symptoms are more likely from this   Mri brain negative.  Re-evaluated pt and still denies any chest pain to suggest dissection. She only reports mild headache. Migraine cocktail given. Pt handed off to oncoming team pending MRI         ____________________________________________   FINAL CLINICAL IMPRESSION(S) / ED DIAGNOSES   Final diagnoses:  Paresthesia       MEDICATIONS GIVEN DURING THIS VISIT:  Medications  acetaminophen (TYLENOL) tablet 1,000 mg (1,000 mg Oral Patient Refused/Not Given 08/26/20 1225)  metoCLOPramide (REGLAN) injection 10 mg (has no administration in time range)  diphenhydrAMINE (BENADRYL) injection 12.5 mg (has no administration in time range)  sodium chloride flush (NS) 0.9 % injection 3 mL (3 mLs Intravenous Given 08/26/20 1127)     ED Discharge Orders    None       Note:  This document was prepared using Dragon voice recognition software and may include unintentional dictation errors.   Concha Se, MD 08/26/20 [phone]

## 2020-08-26 NOTE — ED Notes (Signed)
Pt calm , collective, denied pain or sob. Ambulatory upon discharge .  

## 2020-10-16 DIAGNOSIS — E042 Nontoxic multinodular goiter: Secondary | ICD-10-CM | POA: Insufficient documentation

## 2021-03-03 DIAGNOSIS — M25571 Pain in right ankle and joints of right foot: Secondary | ICD-10-CM | POA: Insufficient documentation

## 2021-06-27 ENCOUNTER — Encounter: Payer: Self-pay | Admitting: Emergency Medicine

## 2021-06-27 ENCOUNTER — Other Ambulatory Visit: Payer: Self-pay

## 2021-06-27 ENCOUNTER — Telehealth: Payer: Medicare HMO | Admitting: Nurse Practitioner

## 2021-06-27 ENCOUNTER — Ambulatory Visit
Admission: EM | Admit: 2021-06-27 | Discharge: 2021-06-27 | Disposition: A | Discharge status: Home / Self Care | Payer: Medicare HMO | Attending: Emergency Medicine | Admitting: Emergency Medicine

## 2021-06-27 DIAGNOSIS — M79662 Pain in left lower leg: Secondary | ICD-10-CM

## 2021-06-27 DIAGNOSIS — M7989 Other specified soft tissue disorders: Secondary | ICD-10-CM | POA: Diagnosis not present

## 2021-06-27 MED ORDER — DICLOFENAC SODIUM 75 MG PO TBEC: 75.0000 mg | DELAYED_RELEASE_TABLET | Freq: Two times a day (BID) | ORAL | 0 refills | Status: DC

## 2021-06-27 NOTE — ED Provider Notes (Signed)
[facility]    CSN: [id number] Arrival date & time: 06/27/21  1648      History   Chief Complaint Chief Complaint  Patient presents with   Leg Pain   Headache    HPI Meagan Carr is a 66 y.o. female.    Patient presents with a throbbing pain and sensation of heaviness directly behind the knee for 1 week.  Pain has been constant.  Associated swelling occurring directly behind knee.  Endorses a tingling sensation that radiates down the anterior and posterior of the leg.  Range of motion is intact.  Endorses that pain has been causing intermittent headaches and she began to feel nauseous today without episode of vomiting.  Has attempted stretching which has been ineffective.  Denies warmth to the skin, discoloration of skin, fever, chills, pain when bearing weight, numbness.  History of DVT, hypertension, phlebitis, prediabetes, obesity.     Past Medical History:  Diagnosis Date   Hypertension    S/P colonoscopy 2008   Normal   S/P colonoscopy with polypectomy 2001   [place], polyps, unsure what type    Patient Active Problem List   Diagnosis Date Noted   Acute right ankle pain 03/03/2021   Multinodular goiter 10/16/2020   Abnormal chest x-ray 08/12/2020   Blurring of visual image 08/12/2020   Acute lymphadenitis of face, head and neck 08/12/2020   Cervical radiculopathy 08/12/2020   Chronic sinusitis 08/12/2020   Constipation 08/12/2020   Dyspnea 08/12/2020   Elevated blood-pressure reading without diagnosis of hypertension 08/12/2020   Gastroesophageal reflux disease without esophagitis 08/12/2020   Left lower lobe pneumonia 08/12/2020   Paresthesia of upper extremity 08/12/2020   Peroneal tendinitis 08/12/2020   Pneumonia 08/12/2020   Prediabetes 08/12/2020   Skin lesion 08/12/2020   Steatosis of liver 08/12/2020   Visual disturbance 08/12/2020   Abnormal weight gain 07/13/2016   Obesity, Class II, BMI 35-39.9 07/13/2016   TIA (transient  ischemic attack) 12/11/2015   Right carpal tunnel syndrome 08/06/2015   Abdominal pain 09/13/2010   Melena 09/13/2010    Past Surgical History:  Procedure Laterality Date   ABDOMINAL HYSTERECTOMY     APPENDECTOMY     c-section     X2   CHOLECYSTECTOMY      OB History   No obstetric history on file.      Home Medications    Prior to Admission medications   Medication Sig Start Date End Date Taking? Authorizing Provider  albuterol (PROVENTIL HFA;VENTOLIN HFA) 108 (90 Base) MCG/ACT inhaler Inhale 2 puffs into the lungs every 6 (six) hours as needed for wheezing or shortness of breath. 10/30/17   Merlyn Lot, MD  aspirin 81 MG EC tablet Take by mouth. 09/15/20 09/15/21  [provider]  atorvastatin (LIPITOR) 20 MG tablet Take by mouth. 09/15/20 09/15/21  [provider]  gabapentin (NEURONTIN) 300 MG capsule Take 1 capsule (300 mg total) by mouth 3 (three) times daily. 12/12/18 12/12/19  Nance Pear, MD  ibuprofen (ADVIL) 200 MG tablet Take 200 mg by mouth every 6 (six) hours as needed.    [provider]  metoprolol succinate (TOPROL-XL) 25 MG 24 hr tablet Take 1 tablet by mouth daily. 09/15/20 09/15/21  [provider]  omeprazole (PRILOSEC) 20 MG capsule Take 20 mg by mouth daily. 11/28/18   [provider]  pantoprazole (PROTONIX) 40 MG tablet Take by mouth. 10/05/20 10/05/21  [provider]  predniSONE (STERAPRED UNI-PAK 21 TAB) 10 MG (21)  TBPK tablet Per packaging instructions 12/12/18   Nance Pear, MD    Family History Family History  Problem Relation Age of Onset   Breast cancer Mother        metastasized to bones, deceased age 71   Brain cancer Father        deceased age 109   Colon cancer Neg Hx     Social History Social History   Tobacco Use   Smoking status: Never   Smokeless tobacco: Never  Substance Use Topics   Alcohol use: No   Drug use: No     Allergies   Latex   Review of  Systems Review of Systems  Constitutional: Negative.   Respiratory: Negative.    Cardiovascular: Negative.   Musculoskeletal:  Positive for arthralgias. Negative for back pain, gait problem, joint swelling, myalgias, neck pain and neck stiffness.  Skin: Negative.   Neurological:  Positive for headaches. Negative for dizziness, tremors, seizures, syncope, facial asymmetry, speech difficulty, weakness, light-headedness and numbness.    Physical Exam Triage Vital Signs ED Triage Vitals  Enc Vitals Group     BP 06/27/21 1713 (!) 141/98     Pulse Rate 06/27/21 1713 86     Resp 06/27/21 1713 16     Temp 06/27/21 1713 98.4 F (36.9 C)     Temp Source 06/27/21 1713 Oral     SpO2 06/27/21 1713 97 %     Weight --      Height --      Head Circumference --      Peak Flow --      Pain Score 06/27/21 1711 9     Pain Loc --      Pain Edu? --      Excl. in [place]? --    No data found.  Updated Vital Signs BP (!) 141/98    Pulse 86    Temp 98.4 F (36.9 C) (Oral)    Resp 16    SpO2 97%   Visual Acuity Right Eye Distance:   Left Eye Distance:   Bilateral Distance:    Right Eye Near:   Left Eye Near:    Bilateral Near:     Physical Exam Constitutional:      Appearance: Normal appearance.  HENT:     Head: Normocephalic.  Eyes:     Extraocular Movements: Extraocular movements intact.  Pulmonary:     Effort: Pulmonary effort is normal.  Skin:    Comments: Tenderness present directly over a vein lying horizontally to the bend of the knee, no swelling, erythema noted, no tenderness to the calf, range of motion intact, sensation intact, 2+ popliteal pulse  Neurological:     Mental Status: She is alert and oriented to person, place, and time. Mental status is at baseline.  Psychiatric:        Mood and Affect: Mood normal.        Behavior: Behavior normal.     UC Treatments / Results  Labs (all labs ordered are listed, but only abnormal results are displayed) Labs Reviewed - No  data to display  EKG   Radiology No results found.  Procedures Procedures (including critical care time)  Medications Ordered in UC Medications - No data to display  Initial Impression / Assessment and Plan / UC Course  I have reviewed the triage vital signs and the nursing notes.  Pertinent labs & imaging results that were available during my care of the patient were reviewed by  me and considered in my medical decision making (see chart for details).  Pain in the left lower leg  Vital signs are stable, symptoms are not consistent with DVT and most likely a phlebitis as tenderness is felt directly over vein, discussed with patient, per patient request we will proceed with ultrasound imaging to rule out DVT, schedule for 9:30 AM on 06/28/2021 to be followed up by this provider, started treatment for phlebitis, prescribed diclofenac twice daily for 7 days then as needed, recommended compression hose, pillows for support, patient may use heat in 15-minute intervals once DVT ruled out, discussed with patient  Final Clinical Impressions(s) / UC Diagnoses   Final diagnoses:  Pain of left calf   Discharge Instructions   None    ED Prescriptions   None    PDMP not reviewed this encounter.   Hans Eden, NP 06/28/21 1242

## 2021-06-27 NOTE — Progress Notes (Signed)
Virtual Visit Consent   Meagan Carr, you are scheduled for a virtual visit with a [facility] provider today.     Just as with appointments in the office, your consent must be obtained to participate.  Your consent will be active for this visit and any virtual visit you may have with one of our providers in the next 365 days.     If you have a MyChart account, a copy of this consent can be sent to you electronically.  All virtual visits are billed to your insurance company just like a traditional visit in the office.    As this is a virtual visit, video technology does not allow for your provider to perform a traditional examination.  This may limit your provider's ability to fully assess your condition.  If your provider identifies any concerns that need to be evaluated in person or the need to arrange testing (such as labs, EKG, etc.), we will make arrangements to do so.     Although advances in technology are sophisticated, we cannot ensure that it will always work on either your end or our end.  If the connection with a video visit is poor, the visit may have to be switched to a telephone visit.  With either a video or telephone visit, we are not always able to ensure that we have a secure connection.     I need to obtain your verbal consent now.   Are you willing to proceed with your visit today?    [name] has provided verbal consent on 06/27/2021 for a virtual visit (telephone). Unable to connect via video    [name], FNP   Date: 06/27/2021 4:34 PM   Virtual Visit via Video Note   I, [name], connected with  Meagan Carr  ([id number], [birth year]) on 06/27/21 at  4:30 PM EST by a video-enabled telemedicine application and verified that I am speaking with the correct person using two identifiers.  Location: Patient: Virtual Visit Location Patient: Home Provider: Virtual Visit Location Provider: Home Office   I discussed the limitations of evaluation and management  by telemedicine and the availability of in person appointments. The patient expressed understanding and agreed to proceed.    History of Present Illness: Meagan Carr is a 66 y.o. who identifies as a female who was assigned female at birth, and is being seen today for pain behind her left leg for the past week. The leg has now started to throb and tingle. She has developed a headache with some nausea in the past day.   She has had a TIA in the past.   Takes 81mg  ASA every evening   Is currently sitting in the parking lot of [facility]  Problems:  Patient Active Problem List   Diagnosis Date Noted   Acute right ankle pain 03/03/2021   Multinodular goiter 10/16/2020   Abnormal chest x-ray 08/12/2020   Blurring of visual image 08/12/2020   Acute lymphadenitis of face, head and neck 08/12/2020   Cervical radiculopathy 08/12/2020   Chronic sinusitis 08/12/2020   Constipation 08/12/2020   Dyspnea 08/12/2020   Elevated blood-pressure reading without diagnosis of hypertension 08/12/2020   Gastroesophageal reflux disease without esophagitis 08/12/2020   Left lower lobe pneumonia 08/12/2020   Paresthesia of upper extremity 08/12/2020   Peroneal tendinitis 08/12/2020   Pneumonia 08/12/2020   Prediabetes 08/12/2020   Skin lesion 08/12/2020   Steatosis of liver 08/12/2020   Visual disturbance 08/12/2020  Abnormal weight gain 07/13/2016   Obesity, Class II, BMI 35-39.9 07/13/2016   TIA (transient ischemic attack) 12/11/2015   Right carpal tunnel syndrome 08/06/2015   Abdominal pain 09/13/2010   Melena 09/13/2010    Allergies:  Allergies  Allergen Reactions   Latex Itching and Rash   Medications:  Current Outpatient Medications:    aspirin 81 MG EC tablet, Take by mouth., Disp: , Rfl:    atorvastatin (LIPITOR) 20 MG tablet, Take by mouth., Disp: , Rfl:    metoprolol succinate (TOPROL-XL) 25 MG 24 hr tablet, Take 1 tablet by mouth daily., Disp: , Rfl:    pantoprazole (PROTONIX) 40  MG tablet, Take by mouth., Disp: , Rfl:    albuterol (PROVENTIL HFA;VENTOLIN HFA) 108 (90 Base) MCG/ACT inhaler, Inhale 2 puffs into the lungs every 6 (six) hours as needed for wheezing or shortness of breath., Disp: 1 Inhaler, Rfl: 2   gabapentin (NEURONTIN) 300 MG capsule, Take 1 capsule (300 mg total) by mouth 3 (three) times daily., Disp: 30 capsule, Rfl: 0   ibuprofen (ADVIL) 200 MG tablet, Take 200 mg by mouth every 6 (six) hours as needed., Disp: , Rfl:    omeprazole (PRILOSEC) 20 MG capsule, Take 20 mg by mouth daily., Disp: , Rfl:    predniSONE (STERAPRED UNI-PAK 21 TAB) 10 MG (21) TBPK tablet, Per packaging instructions, Disp: 21 tablet, Rfl: 0  Observations/Objective: Telephone appointment no physical assessment performed   No labored breathing.  Speech is clear and coherent with logical content.  Patient is alert and oriented at baseline.    Assessment and Plan: 1. Pain and swelling of left lower leg Patient advised to have in person evaluation to rule out DVT  Patient is outside UC will go in immediately for evaluation    Follow Up Instructions: I discussed the assessment and treatment plan with the patient. The patient was provided an opportunity to ask questions and all were answered. The patient agreed with the plan and demonstrated an understanding of the instructions.  A copy of instructions were sent to the patient via MyChart unless otherwise noted below.   The patient was advised to call back or seek an in-person evaluation if the symptoms worsen or if the condition fails to improve as anticipated.  Time:  I spent 15 minutes with the patient via telehealth technology discussing the above problems/concerns.    [name], FNP

## 2021-06-27 NOTE — Discharge Instructions (Addendum)
Your appointment for ultrasound is tomorrow at 9:30 AM Location address is [address] Once receive your ultrasound you will return home and will be notified via telephone of results and what to do   Your symptoms today are not consistent with a blood clot and seem to be more consistent with inflammation of your vein  Take Diclofenac twice a day for the next 7 days  The treatment for this is supportive and typically resolves with time  Begin taking diclofenac twice a day for the next 7 days to help reduce inflammation and irritation  In addition you may use a muscle relaxer at bedtime  After your ultrasound if no blood clot is confirmed you may place heat over the affected area 15-minute intervals  You may place pillows under your legs for support while lying and sitting  You may use compression hose to help reduce swelling and aid in further support

## 2021-06-27 NOTE — ED Triage Notes (Signed)
PT reports pain behind left knee for 1 week. Now she has some numbness in that leg.   Nausea and headache started today.   PT is concerned about a blood clot in her leg.

## 2021-06-28 ENCOUNTER — Other Ambulatory Visit: Payer: Self-pay | Admitting: Emergency Medicine

## 2021-06-28 ENCOUNTER — Ambulatory Visit
Admission: RE | Admit: 2021-06-28 | Discharge: 2021-06-28 | Disposition: A | Discharge status: Home / Self Care | Payer: Medicare HMO | Source: Ambulatory Visit | Attending: Emergency Medicine | Admitting: Emergency Medicine

## 2021-06-28 ENCOUNTER — Telehealth: Payer: Self-pay | Admitting: Emergency Medicine

## 2021-06-28 DIAGNOSIS — M7122 Synovial cyst of popliteal space [Baker], left knee: Secondary | ICD-10-CM | POA: Insufficient documentation

## 2021-06-28 DIAGNOSIS — M25475 Effusion, left foot: Secondary | ICD-10-CM

## 2021-06-28 DIAGNOSIS — M79662 Pain in left lower leg: Secondary | ICD-10-CM | POA: Diagnosis not present

## 2021-06-28 MED ORDER — IBUPROFEN 800 MG PO TABS: 800.0000 mg | ORAL_TABLET | Freq: Three times a day (TID) | ORAL | 0 refills | Status: DC

## 2021-06-28 NOTE — Telephone Encounter (Signed)
Notify patient via telephone, 2 patient identifiers used of results of ultrasound completed this morning, negative for DVT but positive for Baker's cyst, patient endorses that she is unable to use diclofenac which was prescribed for her last night, ibuprofen 800 mg 3 times daily for 5 days recommended instead, prescription sent to pharmacy patient to follow-up with orthopedic specialist for persistent or worsening symptoms for reevaluation and removal of fluid, verbalized understanding of treatment plan

## 2022-03-20 HISTORY — PX: CATARACT EXTRACTION: SUR2

## 2022-06-28 ENCOUNTER — Other Ambulatory Visit: Payer: Self-pay

## 2022-06-28 ENCOUNTER — Emergency Department
Admission: EM | Admit: 2022-06-28 | Discharge: 2022-06-28 | Disposition: A | Discharge status: Home / Self Care | Payer: Medicare HMO | Attending: Emergency Medicine | Admitting: Emergency Medicine

## 2022-06-28 ENCOUNTER — Emergency Department: Payer: Medicare HMO

## 2022-06-28 DIAGNOSIS — M542 Cervicalgia: Secondary | ICD-10-CM | POA: Insufficient documentation

## 2022-06-28 DIAGNOSIS — R519 Headache, unspecified: Secondary | ICD-10-CM | POA: Diagnosis not present

## 2022-06-28 LAB — BASIC METABOLIC PANEL
Anion gap: 5 (ref 5–15)
BUN: 19 mg/dL (ref 8–23)
CO2: 30 mmol/L (ref 22–32)
Calcium: 9.6 mg/dL (ref 8.9–10.3)
Chloride: 104 mmol/L (ref 98–111)
Creatinine, Ser: 0.89 mg/dL (ref 0.44–1.00)
GFR, Estimated: >60 mL/min (ref >60)
Glucose, Bld: 123 mg/dL — ABNORMAL HIGH (ref 70–99)
Potassium: 4.4 mmol/L (ref 3.5–5.1)
Sodium: 139 mmol/L (ref 135–145)

## 2022-06-28 LAB — CBC
HCT: 40.1 % (ref 36.0–46.0)
Hemoglobin: 12.7 g/dL (ref 12.0–15.0)
MCH: 29.1 pg (ref 26.0–34.0)
MCHC: 31.7 g/dL (ref 30.0–36.0)
MCV: 92 fL (ref 80.0–100.0)
Platelets: 231 10*3/uL (ref 150–400)
RBC: 4.36 MIL/uL (ref 3.87–5.11)
RDW: 12.8 % (ref 11.5–15.5)
WBC: 5.4 10*3/uL (ref 4.0–10.5)
nRBC: 0 % (ref 0.0–0.2)

## 2022-06-28 LAB — TROPONIN I (HIGH SENSITIVITY): Troponin I (High Sensitivity): 4 ng/L (ref <18)

## 2022-06-28 MED ORDER — BUTALBITAL-APAP-CAFFEINE 50-325-40 MG PO TABS: 1.0000 | ORAL_TABLET | Freq: Four times a day (QID) | ORAL | 0 refills | Status: AC | PRN

## 2022-06-28 NOTE — ED Provider Notes (Signed)
[facility] Provider Note    Event Date/Time   First MD Initiated Contact with Patient 06/28/22 1257     (approximate)   History   Headache   HPI  Meagan Carr is a 67 y.o. female   presents to the ED with complaint of headache that started this morning and also several days of left-sided neck discomfort and sensation of swelling.  She denies any visual changes, dizziness with her headache.  Patient also reports some chest pressure and heaviness that has been on and off for the last week.  Patient denies any difficulty breathing, shortness of breath, fever, chills, nausea, vomiting or any other upper respiratory symptoms.  She reports that she was hospitalized in [place], [place] and had a full workup of her headaches and also is aware that she has nodules on her thyroid.  Patient has not taken any over-the-counter medication for her headache.      Physical Exam   Triage Vital Signs: ED Triage Vitals  Enc Vitals Group     BP 06/28/22 1147 (!) 148/93     Pulse Rate 06/28/22 1147 69     Resp 06/28/22 1147 18     Temp 06/28/22 1147 97.9 F (36.6 C)     Temp src --      SpO2 06/28/22 1147 97 %     Weight --      Height --      Head Circumference --      Peak Flow --      Pain Score 06/28/22 1146 7     Pain Loc --      Pain Edu? --      Excl. in [place]? --     Most recent vital signs: Vitals:   06/28/22 1147 06/28/22 1258  BP: (!) 148/93 (!) 141/72  Pulse: 69 72  Resp: 18 18  Temp: 97.9 F (36.6 C)   SpO2: 97% 97%     General: Awake, no distress.  Patient is alert, talkative, oriented x 3.  Able to stand and ambulate without any assistance.  Answers questions appropriately.  Speech is normal. CV:  Good peripheral perfusion.  Heart regular rate and rhythm. Resp:  Normal effort.  Lungs are clear bilaterally. Abd:  No distention.  Other:  PERRLA, EOMI's, cranial nerves II through XII grossly intact, examination of the anterior neck there appears to  be a thyroid goiter.  Nontender to palpation.  EACs and TMs clear.  No point tenderness on palpation of cervical spine posteriorly.  There is some minimal tenderness on palpation of the bilateral cervical muscles into the trapezius area.  Speech is normal.  Good muscle strength bilaterally.   ED Results / Procedures / Treatments   Labs (all labs ordered are listed, but only abnormal results are displayed) Labs Reviewed  BASIC METABOLIC PANEL - Abnormal; Notable for the following components:      Result Value   Glucose, Bld 123 (*)    All other components within normal limits  CBC  TROPONIN I (HIGH SENSITIVITY)     EKG  Vent. rate 69 BPM PR interval 176 ms QRS duration 76 ms QT/QTcB 384/411 ms P-R-T axes 53 24 54 Normal sinus rhythm Cannot rule out Anterior infarct (cited on or before 30-Oct-2017) Abnormal ECG When compared with ECG of 26-Aug-2020 10:15, Nonspecific T wave abnormality no longer evident in Inferior leads   RADIOLOGY  Chest x-ray images were reviewed by myself independent of the radiologist and  was negative.  Official radiology report is negative acute cardiopulmonary changes.   PROCEDURES:  Critical Care performed:   Procedures   MEDICATIONS ORDERED IN ED: Medications - No data to display   IMPRESSION / MDM / [place] / ED COURSE  I reviewed the triage vital signs and the nursing notes.   Differential diagnosis includes, but is not limited to, migraine headache, combination muscle contraction headache, nonspecific chest pain, cardiac etiology to rule out.  67 year old female presents to the ED with complaint of headache that started this morning with left-sided pain, discomfort and soft tissue edema noted to the left side of her neck.  Patient has not taken any over-the-counter medication for her headache.  She also complains of chest pressure and heaviness off and on for the past week.  Patient drove herself to the emergency department  was made aware that we cannot give her any narcotics while she was in the emergency department.  In looking through her records she acknowledges that she did have a complete workup including CT scan, MRI, MRA, of her head, ultrasound of her thyroid which showed multi nodular goiter with the left being greater than the right, a biopsy to rule out temporal arteritis, a trial of Imitrex which she did not respond to and her doctor considered that there was a muscular component to her headaches.  This was all done at [facility], [place], [place].  After reviewing all these results and discussing this with patient she does not wish for any of these to be repeated but would be willing to try something different for her headache.  We discussed Fioricet and the fact that it could cause some drowsiness.  A prescription for this was sent to her pharmacy and she was made aware that she should not drive while taking this medication.  She will contact her PCP in [place].  Also a list of PCP offices in this area was on her discharge papers as she is decided to stay in [place] rather than move to [place].  Lab work was reassuring with a troponin 4, CBC and BMP unremarkable, chest x-ray was negative and EKG as noted above.       Patient's presentation is most consistent with Patient's presentation is most consistent with exacerbation of chronic illness. FINAL CLINICAL IMPRESSION(S) / ED DIAGNOSES   Final diagnoses:  Headache disorder     Rx / DC Orders   ED Discharge Orders          Ordered    butalbital-acetaminophen-caffeine (FIORICET) 50-325-40 MG tablet  Every 6 hours PRN        06/28/22 1337             Note:  This document was prepared using Dragon voice recognition software and may include unintentional dictation errors.   [name], PA-C 06/28/22 1507    Vanessa Glenwood, MD 06/29/22 [phone]

## 2022-06-28 NOTE — ED Triage Notes (Signed)
Pt comes with c/o head pain that started this am. Pt states left sided and down her neck. Pt also states some chest pressure and heaviness. Pt states this has been on and off for last week.

## 2022-06-28 NOTE — Discharge Instructions (Signed)
Follow-up with your primary care provider because you are trying to establish primary care in [facility] a list of clinics are listed on your discharge papers.  A prescription for Fioricet was sent to the pharmacy to take as needed for your headaches.  Be aware that this medication could cause drowsiness and increase your risk for injury.  Do not drive or operate machinery while taking this medication.  Please go to the following website to schedule new (and existing) patient appointments:   [email or web address]   The following is a list of primary care offices in the area who are accepting new patients at this time.  Please reach out to one of them directly and let them know you would like to schedule an appointment to follow up on an Emergency Department visit, and/or to establish a new primary care provider (PCP).  There are likely other primary care clinics in the are who are accepting new patients, but this is an excellent place to start:  [facility] physician: Dr Lavon Paganini [address] [place], [place] [address] [phone]  [facility] Lead Physician: Dr Steele Sizer [address], [place], [place] [address] [phone]  [place] Physician: Dr Park Liter [address] [place], [place] [address] [phone]  [facility] Lead Physician: Dr Dewaine Oats [address], [place], [place] [address] [phone]  [facility] Physician: Dr Halina Maidens [address] [place], [place], [place] [address] [phone]

## 2022-11-01 ENCOUNTER — Emergency Department: Payer: Medicare HMO

## 2022-11-01 ENCOUNTER — Other Ambulatory Visit: Payer: Self-pay

## 2022-11-01 ENCOUNTER — Emergency Department
Admission: EM | Admit: 2022-11-01 | Discharge: 2022-11-01 | Disposition: A | Discharge status: Home / Self Care | Payer: Medicare HMO | Attending: Emergency Medicine | Admitting: Emergency Medicine

## 2022-11-01 DIAGNOSIS — M5412 Radiculopathy, cervical region: Secondary | ICD-10-CM | POA: Insufficient documentation

## 2022-11-01 DIAGNOSIS — M542 Cervicalgia: Secondary | ICD-10-CM | POA: Diagnosis present

## 2022-11-01 MED ORDER — METHOCARBAMOL 500 MG PO TABS: 500.0000 mg | ORAL_TABLET | Freq: Four times a day (QID) | ORAL | 0 refills | Status: DC

## 2022-11-01 MED ORDER — MELOXICAM 15 MG PO TABS: 15.0000 mg | ORAL_TABLET | Freq: Every day | ORAL | 0 refills | Status: DC

## 2022-11-01 MED ORDER — DEXAMETHASONE SODIUM PHOSPHATE 10 MG/ML IJ SOLN
10.0000 mg | Freq: Once | INTRAMUSCULAR | Status: AC
  Administered 2022-11-01: 10 mg via INTRAMUSCULAR
  Filled 2022-11-01: qty 1

## 2022-11-01 MED ORDER — KETOROLAC TROMETHAMINE 30 MG/ML IJ SOLN
15.0000 mg | Freq: Once | INTRAMUSCULAR | Status: AC
  Administered 2022-11-01: 15 mg via INTRAMUSCULAR
  Filled 2022-11-01: qty 1

## 2022-11-01 MED ORDER — PREDNISONE 50 MG PO TABS: 50.0000 mg | ORAL_TABLET | Freq: Every day | ORAL | 0 refills | Status: DC
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
## 2022-11-01 NOTE — ED Provider Notes (Signed)
[facility] Provider Note  Patient Contact: 5:46 PM (approximate)   History   Shoulder Pain   HPI  Meagan Carr is a 67 y.o. female who presents the emergency department complaining of pain radiating from her neck down her left arm.  Nontraumatic in nature.  She does have a history of bulging disc in her neck.  No acute trauma, no loss of sensation is reported.  Patient has limited range of motion due to pain.  She feels like it is radiating primarily from her neck through her upper arm.     Physical Exam   Triage Vital Signs: ED Triage Vitals  Enc Vitals Group     BP 11/01/22 1447 (!) 172/91     Pulse Rate 11/01/22 1447 80     Resp 11/01/22 1447 18     Temp 11/01/22 1447 97.9 F (36.6 C)     Temp src --      SpO2 11/01/22 1447 96 %     Weight 11/01/22 1450 260 lb (117.9 kg)     Height 11/01/22 1450 5\' 3"  (1.6 m)     Head Circumference --      Peak Flow --      Pain Score 11/01/22 1449 10     Pain Loc --      Pain Edu? --      Excl. in GC? --     Most recent vital signs: Vitals:   11/01/22 1447  BP: (!) 172/91  Pulse: 80  Resp: 18  Temp: 97.9 F (36.6 C)  SpO2: 96%     General: Alert and in no acute distress. Head: No acute traumatic findings  Neck: No stridor. No midline cervical spine tenderness to palpation.  Tender to palpation left paraspinal muscles extending into the trapezius muscle range.  No palpable abnormality over the cervical spine.  Pulse and sensation intact and equal upper extremities. Cardiovascular:  Good peripheral perfusion Respiratory: Normal respiratory effort without tachypnea or retractions. Lungs CTAB. Musculoskeletal: Full range of motion to all extremities.  Neurologic:  No gross focal neurologic deficits are appreciated.  Skin:   No rash noted Other:   ED Results / Procedures / Treatments   Labs (all labs ordered are listed, but only abnormal results are displayed) Labs Reviewed - No data to  display   EKG     RADIOLOGY  I personally viewed, evaluated, and interpreted these images as part of my medical decision making, as well as reviewing the written report by the radiologist.  ED Provider Interpretation: No acute traumatic finding on x-ray of the shoulder  DG Shoulder Left  Result Date: 11/01/2022 CLINICAL DATA:  Left shoulder pain EXAM: LEFT SHOULDER - 2+ VIEW COMPARISON:  None Available. FINDINGS: There is no evidence of fracture or dislocation. There is no evidence of arthropathy or other focal bone abnormality. Soft tissues are unremarkable. IMPRESSION: Negative. Electronically Signed   By: Charlett Nose M.D.   On: 11/01/2022 16:17    PROCEDURES:  Critical Care performed: No  Procedures   MEDICATIONS ORDERED IN ED: Medications  ketorolac (TORADOL) 30 MG/ML injection 15 mg (15 mg Intramuscular Given 11/01/22 1759)  dexamethasone (DECADRON) injection 10 mg (10 mg Intramuscular Given 11/01/22 1800)     IMPRESSION / MDM / ASSESSMENT AND PLAN / ED COURSE  I reviewed the triage vital signs and the nursing notes.  Differential diagnosis includes, but is not limited to, cervical strain, cervical radiculopathy, impingement syndrome of the shoulder, rotator cuff tendinitis, rotator cuff tear  Patient's presentation is most consistent with acute presentation with potential threat to life or bodily function.   Patient's diagnosis is consistent with cervical radiculopathy.  Patient presents emergency department complaining of pain rating from her neck down to her left hand.  No trauma.  Does have a history of bulging disc in her cervical spine.  Examination was overall reassuring.  Imaging reveals no acute finding to her shoulder.  Patient will be treated with some control of anti-inflammatory, prednisone and muscle relaxer.  Follow-up primary care or orthopedics as needed.  Patient is given ED precautions to return to the ED for any worsening  or new symptoms.     FINAL CLINICAL IMPRESSION(S) / ED DIAGNOSES   Final diagnoses:  Cervical radiculopathy     Rx / DC Orders   ED Discharge Orders          Ordered    meloxicam (MOBIC) 15 MG tablet  Daily        11/01/22 1748    predniSONE (DELTASONE) 50 MG tablet  Daily with breakfast        11/01/22 1748    methocarbamol (ROBAXIN) 500 MG tablet  4 times daily        11/01/22 1748             Note:  This document was prepared using Dragon voice recognition software and may include unintentional dictation errors.   Lanette Hampshire 11/01/22 1806    Georga Hacking, MD 11/01/22 [phone]

## 2022-11-20 ENCOUNTER — Other Ambulatory Visit: Payer: Self-pay | Admitting: Family Medicine

## 2022-11-20 DIAGNOSIS — M5412 Radiculopathy, cervical region: Secondary | ICD-10-CM

## 2022-11-21 ENCOUNTER — Ambulatory Visit
Admission: RE | Admit: 2022-11-21 | Discharge: 2022-11-21 | Disposition: A | Discharge status: Home / Self Care | Payer: Medicare HMO | Source: Ambulatory Visit | Attending: Family Medicine | Admitting: Family Medicine

## 2022-11-21 DIAGNOSIS — M5412 Radiculopathy, cervical region: Secondary | ICD-10-CM

## 2022-11-28 ENCOUNTER — Other Ambulatory Visit: Payer: Self-pay | Admitting: Family Medicine

## 2022-11-28 DIAGNOSIS — M5412 Radiculopathy, cervical region: Secondary | ICD-10-CM

## 2022-12-07 ENCOUNTER — Inpatient Hospital Stay
Admission: RE | Admit: 2022-12-07 | Discharge: 2022-12-07 | Disposition: A | Discharge status: Home / Self Care | Payer: Medicare HMO | Source: Ambulatory Visit | Attending: Family Medicine | Admitting: Family Medicine

## 2022-12-07 NOTE — Discharge Instructions (Signed)
Post Procedure Spinal Discharge Instruction Sheet  You may resume a regular diet and any medications that you routinely take (including pain medications) unless otherwise noted by MD.  No driving day of procedure.  Light activity throughout the rest of the day.  Do not do any strenuous work, exercise, bending or lifting.  The day following the procedure, you can resume normal physical activity but you should refrain from exercising or physical therapy for at least three days thereafter.  You may apply ice to the injection site, 20 minutes on, 20 minutes off, as needed. Do not apply ice directly to skin.    Common Side Effects:  Headaches- take your usual medications as directed by your physician.  Increase your fluid intake.  Caffeinated beverages may be helpful.  Lie flat in bed until your headache resolves.  Restlessness or inability to sleep- you may have trouble sleeping for the next few days.  Ask your referring physician if you need any medication for sleep.  Facial flushing or redness- should subside within a few days.  Increased pain- a temporary increase in pain a day or two following your procedure is not unusual.  Take your pain medication as prescribed by your referring physician.  Leg cramps  Please contact our office at [phone] for the following symptoms: Fever greater than 100 degrees. Headaches unresolved with medication after 2-3 days. Increased swelling, pain, or redness at injection site.  May resume aspirin today!  Thank you for visiting [facility]!

## 2022-12-19 NOTE — Discharge Instructions (Signed)

## 2022-12-20 ENCOUNTER — Ambulatory Visit
Admission: RE | Admit: 2022-12-20 | Discharge: 2022-12-20 | Disposition: A | Discharge status: Home / Self Care | Payer: Medicare HMO | Source: Ambulatory Visit | Attending: Family Medicine | Admitting: Family Medicine

## 2022-12-20 DIAGNOSIS — M5412 Radiculopathy, cervical region: Secondary | ICD-10-CM

## 2022-12-20 MED ORDER — TRIAMCINOLONE ACETONIDE 40 MG/ML IJ SUSP (RADIOLOGY)
60.0000 mg | Freq: Once | INTRAMUSCULAR | Status: AC
  Administered 2022-12-20: 60 mg via EPIDURAL

## 2022-12-20 MED ORDER — IOPAMIDOL (ISOVUE-M 300) INJECTION 61%
1.0000 mL | Freq: Once | INTRAMUSCULAR | Status: AC
  Administered 2022-12-20: 1 mL via EPIDURAL

## 2023-01-17 ENCOUNTER — Other Ambulatory Visit: Payer: Self-pay | Admitting: Family Medicine

## 2023-01-17 DIAGNOSIS — M5412 Radiculopathy, cervical region: Secondary | ICD-10-CM

## 2023-02-09 ENCOUNTER — Encounter: Payer: Self-pay | Admitting: Physical Medicine & Rehabilitation

## 2023-02-16 ENCOUNTER — Other Ambulatory Visit: Payer: Medicare HMO

## 2023-02-20 NOTE — Discharge Instructions (Signed)
Post Procedure Spinal Discharge Instruction Sheet  You may resume a regular diet and any medications that you routinely take (including pain medications) unless otherwise noted by MD.  No driving day of procedure.  Light activity throughout the rest of the day.  Do not do any strenuous work, exercise, bending or lifting.  The day following the procedure, you can resume normal physical activity but you should refrain from exercising or physical therapy for at least three days thereafter.  You may apply ice to the injection site, 20 minutes on, 20 minutes off, as needed. Do not apply ice directly to skin.    Common Side Effects:  Headaches- take your usual medications as directed by your physician.  Increase your fluid intake.  Caffeinated beverages may be helpful.  Lie flat in bed until your headache resolves.  Restlessness or inability to sleep- you may have trouble sleeping for the next few days.  Ask your referring physician if you need any medication for sleep.  Facial flushing or redness- should subside within a few days.  Increased pain- a temporary increase in pain a day or two following your procedure is not unusual.  Take your pain medication as prescribed by your referring physician.  Leg cramps  Please contact our office at [phone] for the following symptoms: Fever greater than 100 degrees. Headaches unresolved with medication after 2-3 days. Increased swelling, pain, or redness at injection site.   Thank you for visiting [facility] today.    YOU MAY RESUME YOUR ASPIRIN TODAY, POST PROCEDURE.

## 2023-02-21 ENCOUNTER — Ambulatory Visit
Admission: RE | Admit: 2023-02-21 | Discharge: 2023-02-21 | Disposition: A | Discharge status: Home / Self Care | Payer: Medicare HMO | Source: Ambulatory Visit | Attending: Family Medicine | Admitting: Family Medicine

## 2023-02-21 DIAGNOSIS — M5412 Radiculopathy, cervical region: Secondary | ICD-10-CM

## 2023-02-21 MED ORDER — TRIAMCINOLONE ACETONIDE 40 MG/ML IJ SUSP (RADIOLOGY)
60.0000 mg | Freq: Once | INTRAMUSCULAR | Status: AC
  Administered 2023-02-21: 60 mg via EPIDURAL

## 2023-02-21 MED ORDER — IOPAMIDOL (ISOVUE-M 300) INJECTION 61%
1.0000 mL | Freq: Once | INTRAMUSCULAR | Status: AC | PRN
  Administered 2023-02-21: 1 mL via EPIDURAL

## 2023-03-30 HISTORY — PX: CATARACT EXTRACTION: SUR2

## 2023-05-07 ENCOUNTER — Encounter: Payer: Self-pay | Admitting: Family Medicine

## 2023-05-07 DIAGNOSIS — Z1231 Encounter for screening mammogram for malignant neoplasm of breast: Secondary | ICD-10-CM

## 2023-05-09 ENCOUNTER — Other Ambulatory Visit: Payer: Self-pay | Admitting: Family Medicine

## 2023-05-09 DIAGNOSIS — N63 Unspecified lump in unspecified breast: Secondary | ICD-10-CM

## 2023-05-15 ENCOUNTER — Other Ambulatory Visit: Payer: Self-pay | Admitting: *Deleted

## 2023-05-15 ENCOUNTER — Inpatient Hospital Stay
Admission: RE | Admit: 2023-05-15 | Discharge: 2023-05-15 | Disposition: A | Discharge status: Home / Self Care | Payer: Self-pay | Source: Ambulatory Visit | Attending: Family Medicine | Admitting: Family Medicine

## 2023-05-15 DIAGNOSIS — Z1231 Encounter for screening mammogram for malignant neoplasm of breast: Secondary | ICD-10-CM

## 2023-05-16 ENCOUNTER — Ambulatory Visit
Admission: RE | Admit: 2023-05-16 | Discharge: 2023-05-16 | Disposition: A | Discharge status: Home / Self Care | Payer: Medicare HMO | Source: Ambulatory Visit | Attending: Family Medicine | Admitting: Family Medicine

## 2023-05-16 ENCOUNTER — Inpatient Hospital Stay
Admission: RE | Admit: 2023-05-16 | Discharge: 2023-05-16 | Discharge status: Home / Self Care | Payer: Medicare HMO | Source: Ambulatory Visit | Attending: Family Medicine | Admitting: Family Medicine

## 2023-05-16 DIAGNOSIS — N644 Mastodynia: Secondary | ICD-10-CM | POA: Insufficient documentation

## 2023-05-16 DIAGNOSIS — N6012 Diffuse cystic mastopathy of left breast: Secondary | ICD-10-CM | POA: Diagnosis not present

## 2023-05-16 DIAGNOSIS — M542 Cervicalgia: Secondary | ICD-10-CM | POA: Diagnosis present

## 2023-05-16 DIAGNOSIS — N63 Unspecified lump in unspecified breast: Secondary | ICD-10-CM

## 2023-05-16 DIAGNOSIS — R0789 Other chest pain: Secondary | ICD-10-CM | POA: Diagnosis present

## 2023-06-05 ENCOUNTER — Other Ambulatory Visit: Payer: Self-pay | Admitting: Family Medicine

## 2023-06-05 DIAGNOSIS — M4807 Spinal stenosis, lumbosacral region: Secondary | ICD-10-CM

## 2023-06-05 DIAGNOSIS — M5412 Radiculopathy, cervical region: Secondary | ICD-10-CM

## 2023-06-09 ENCOUNTER — Other Ambulatory Visit: Payer: Medicare HMO

## 2023-06-13 ENCOUNTER — Ambulatory Visit
Admission: RE | Admit: 2023-06-13 | Discharge: 2023-06-13 | Disposition: A | Discharge status: Home / Self Care | Payer: Medicare HMO | Source: Ambulatory Visit | Attending: Family Medicine | Admitting: Family Medicine

## 2023-06-13 DIAGNOSIS — M4807 Spinal stenosis, lumbosacral region: Secondary | ICD-10-CM

## 2023-06-15 ENCOUNTER — Ambulatory Visit
Admission: RE | Admit: 2023-06-15 | Discharge: 2023-06-15 | Disposition: A | Discharge status: Home / Self Care | Payer: Medicare HMO | Source: Ambulatory Visit | Attending: Family Medicine | Admitting: Family Medicine

## 2023-06-15 ENCOUNTER — Other Ambulatory Visit: Payer: Medicare HMO

## 2023-06-15 DIAGNOSIS — M5412 Radiculopathy, cervical region: Secondary | ICD-10-CM

## 2023-06-15 MED ORDER — TRIAMCINOLONE ACETONIDE 40 MG/ML IJ SUSP (RADIOLOGY)
60.0000 mg | Freq: Once | INTRAMUSCULAR | Status: AC
  Administered 2023-06-15: 60 mg via EPIDURAL

## 2023-06-15 MED ORDER — IOPAMIDOL (ISOVUE-M 300) INJECTION 61%
1.0000 mL | Freq: Once | INTRAMUSCULAR | Status: AC | PRN
  Administered 2023-06-15: 1 mL via EPIDURAL

## 2023-06-15 NOTE — Discharge Instructions (Signed)

## 2023-07-23 NOTE — Progress Notes (Unsigned)
 Referring Physician:  Denton Lank, FNP [address] [place],  [place] [address]  Primary Physician:  Levonne Lapping, MD  History of Present Illness: 07/26/2023 [name] is here today with a chief complaint of imbalance, tingling in her hands, loss of dexterity, and loss of upper extremity strength.  She has been having worsening symptoms over the past year.  She is additionally having some pain into her arms consistent with a pinched nerve.  She has tried physical therapy and injections without improvement.  Her symptoms have been worsening.  In addition to above, she is also having left leg pain and back pain.  She has difficulty with walking due to this.  She gets significant buttock and leg pain whenever she walks more than a short distance.  She then sits down which helps.    Bowel/Bladder Dysfunction: none  Conservative measures:  Physical therapy:  Has participated in PT at [facility] medical therapy including regular antiinflammatories:  Gabapentin, Diclofenac, Methocarbamol, Prednisone Injections:  06/29/2023: Left L5-S1 transforaminal ESI (dexamethasone 8 mg) 12/20/2022: Left C7-T1 interlaminar ESI at [facility] (60% relief, bad headache)   Past Surgery: none  Yolani K Stoneberg has clear symptoms of cervical myelopathy.  The symptoms are causing a significant impact on the patient's life.   I have utilized the care everywhere function in epic to review the outside records available from external health systems.  Review of Systems:  A 10 point review of systems is negative, except for the pertinent positives and negatives detailed in the HPI.  Past Medical History: Past Medical History:  Diagnosis Date   Hypertension    S/P colonoscopy 2008   Normal   S/P colonoscopy with polypectomy 2001   [place], polyps, unsure what type    Past Surgical History: Past Surgical History:  Procedure Laterality Date   ABDOMINAL HYSTERECTOMY     APPENDECTOMY      c-section     X2   CHOLECYSTECTOMY      Allergies: Allergies as of 07/26/2023 - Review Complete 07/26/2023  Allergen Reaction Noted   Prednisone Other (See Comments) 01/11/2023   Latex Itching and Rash 12/12/2018    Medications:  Current Outpatient Medications:    albuterol (PROVENTIL HFA;VENTOLIN HFA) 108 (90 Base) MCG/ACT inhaler, Inhale 2 puffs into the lungs every 6 (six) hours as needed for wheezing or shortness of breath., Disp: 1 Inhaler, Rfl: 2   ASPIRIN LOW DOSE 81 MG tablet, Take 81 mg by mouth daily., Disp: , Rfl:    fluticasone (FLONASE) 50 MCG/ACT nasal spray, Place into the nose., Disp: , Rfl:    furosemide (LASIX) 20 MG tablet, Take 20 mg by mouth daily., Disp: , Rfl:    losartan (COZAAR) 50 MG tablet, Take 50 mg by mouth daily., Disp: , Rfl:    meloxicam (MOBIC) 15 MG tablet, Take 1 tablet (15 mg total) by mouth daily., Disp: 30 tablet, Rfl: 0   metFORMIN (GLUCOPHAGE-XR) 500 MG 24 hr tablet, Take by mouth., Disp: , Rfl:    methocarbamol (ROBAXIN) 500 MG tablet, Take 1 tablet (500 mg total) by mouth 4 (four) times daily., Disp: 30 tablet, Rfl: 0   Multiple Vitamin (MULTI-VITAMIN) tablet, Take 1 tablet by mouth daily., Disp: , Rfl:    pantoprazole (PROTONIX) 40 MG tablet, Take by mouth., Disp: , Rfl:    atorvastatin (LIPITOR) 20 MG tablet, Take by mouth., Disp: , Rfl:    gabapentin (NEURONTIN) 300 MG capsule, Take 1 capsule (300 mg total)  by mouth 3 (three) times daily., Disp: 30 capsule, Rfl: 0   metoprolol succinate (TOPROL-XL) 25 MG 24 hr tablet, Take 1 tablet by mouth daily., Disp: , Rfl:   Social History: Social History   Tobacco Use   Smoking status: Never   Smokeless tobacco: Never  Substance Use Topics   Alcohol use: No   Drug use: No    Family Medical History: Family History  Problem Relation Age of Onset   Breast cancer Mother        metastasized to bones, deceased age 15   Brain cancer Father        deceased age 1   Breast cancer Sister     Breast cancer Maternal Aunt    Breast cancer Maternal Grandmother    Colon cancer Neg Hx     Physical Examination: Vitals:   07/26/23 1618  BP: 112/78    General: Patient is in no apparent distress. Attention to examination is appropriate.  Neck:   Supple.  Full range of motion.  Respiratory: Patient is breathing without any difficulty.   NEUROLOGICAL:     Awake, alert, oriented to person, place, and time.  Speech is clear and fluent.   Cranial Nerves: Pupils equal round and reactive to light.  Facial tone is symmetric.  Facial sensation is symmetric. Shoulder shrug is symmetric. Tongue protrusion is midline.  There is no pronator drift.  Strength: Side Biceps Triceps Deltoid Interossei Grip Wrist Ext. Wrist Flex.  R 5 5 5 5 4  4+ 5  L 5 5 5 5 4  4+ 5   Side Iliopsoas Quads Hamstring PF DF EHL  R 5 5 5 5 5 5   L 5 5 5 5 5 5    Reflexes are 2+ and symmetric at the biceps, triceps, brachioradialis, patella and achilles.   Hoffman's is present.   Bilateral upper and lower extremity sensation is intact to light touch.    No evidence of dysmetria noted.  Gait is wide-based and very abnormal.  She is unable to perform tandem gait..     Medical Decision Making  Imaging: MRI C spine 11/21/2022 C3-C4: Small central disc protrusion. Right greater than facet and uncovertebral arthropathy. Moderate canal stenosis with moderate right foraminal stenosis. Findings have progressed from prior.   C4-C5: Broad-based disc bulge with right greater than left facet and uncovertebral arthropathy. Moderate canal stenosis with moderate right and mild left foraminal stenosis. No significant interval progression.   C5-C6: Disc osteophyte complex with bilateral facet and uncovertebral arthropathy. Moderate canal stenosis with severe right and moderate left foraminal stenosis. No significant interval progression.   C6-C7: Disc osteophyte complex with left greater than right uncovertebral  spurring and mild facet hypertrophy. Mild canal stenosis with moderate bilateral foraminal stenosis. No significant interval progression.   C7-T1: Bilateral facet hypertrophy and mild uncovertebral spurring. Mild left foraminal stenosis. No canal stenosis. No significant interval progression.   IMPRESSION: 1. Multilevel cervical spondylosis with moderate canal stenosis at C3-4, C4-5, and C5-6. 2. Multilevel foraminal stenosis, severe on the right at C5-6.     Electronically Signed   By: Duanne Guess D.O.   On: 11/23/2022 16:17 MRI L spine 06/13/2023 L1-2: Normal disc. Mild-to-moderate facet and ligamentum flavum hypertrophy without stenosis.   L2-3: Minimal disc bulging and moderate facet and ligamentum flavum hypertrophy without stenosis.   L3-4: Mild disc bulging eccentric to the left and moderate facet and ligamentum flavum hypertrophy result in mild left neural foraminal stenosis without spinal stenosis.  L4-5: Disc bulging, congenitally short pedicles, prominent dorsal epidural fat, and moderate right and severe left facet and ligamentum flavum hypertrophy result in severe spinal stenosis, left greater than right lateral recess stenosis, and moderate bilateral neural foraminal stenosis.   L5-S1: Minimal disc bulging and mild facet hypertrophy without stenosis. Multilevel   IMPRESSION: 1. Lumbar spondylosis and facet arthrosis, most notable at L4-5 where there is severe spinal stenosis and moderate neural foraminal stenosis. 2. Mild spinal stenosis and moderate to severe right neural foraminal stenosis at T11-12.     Electronically Signed   By: Sebastian Ache M.D.   On: 06/15/2023 15:23 I have personally reviewed the images and agree with the above interpretation.  Assessment and Plan: Ms. Klinedinst is a pleasant 68 y.o. 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  She also has cervical radiculopathy.  There is no role for  conservative management for cervical myelopathy.  She has exhausted conservative measures, no further conservative measures are indicated.  Have recommended C3-6 anterior cervical discectomy and fusion.  I discussed the planned procedure at length with the patient, including the risks, benefits, alternatives, and indications. The risks discussed include but are not limited to bleeding, infection, need for reoperation, spinal fluid leak, stroke, vision loss, anesthetic complication, coma, paralysis, and even death. We also discussed the possibility of post-operative dysphagia, vocal cord paralysis, and the risk of adjacent segment disease in the future. I also described in detail that improvement was not guaranteed.  The patient expressed understanding of these risks, and asked that we proceed with surgery. I described the surgery in layman's terms, and gave ample opportunity for questions, which were answered to the best of my ability.  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  She has exhausted conservative measures, no further conservative measures are indicated.  I have recommended L4-5 decompression.  This will be scheduled approximately 2 months after the anterior cervical procedure.  I discussed the planned procedure at length with the patient, including the risks, benefits, alternatives, and indications. The risks discussed include but are not limited to bleeding, infection, need for reoperation, spinal fluid leak, stroke, vision loss, anesthetic complication, coma, paralysis, and even death. I also described in detail that improvement was not guaranteed.  The patient expressed understanding of these risks, and asked that we proceed with surgery. I described the surgery in layman's terms, and gave ample opportunity for questions, which were answered to the best of my ability.  I spent a total of 45 minutes in this patient's care today. This time  was spent reviewing pertinent records including imaging studies, obtaining and confirming history, performing a directed evaluation, formulating and discussing my recommendations, and documenting the visit within the medical record.      Thank you for involving me in the care of this patient.      Sonnie Bias K. Myer Haff MD, [facility]

## 2023-07-26 ENCOUNTER — Other Ambulatory Visit: Payer: Self-pay

## 2023-07-26 ENCOUNTER — Encounter: Payer: Self-pay | Admitting: Neurosurgery

## 2023-07-26 ENCOUNTER — Ambulatory Visit (INDEPENDENT_AMBULATORY_CARE_PROVIDER_SITE_OTHER): Payer: Medicare HMO | Admitting: Neurosurgery

## 2023-07-26 VITALS — BP 112/78 | Ht 63.0 in | Wt 229.0 lb

## 2023-07-26 DIAGNOSIS — M4802 Spinal stenosis, cervical region: Secondary | ICD-10-CM

## 2023-07-26 DIAGNOSIS — M5412 Radiculopathy, cervical region: Secondary | ICD-10-CM | POA: Diagnosis not present

## 2023-07-26 DIAGNOSIS — Z01818 Encounter for other preprocedural examination: Secondary | ICD-10-CM

## 2023-07-26 DIAGNOSIS — M48062 Spinal stenosis, lumbar region with neurogenic claudication: Secondary | ICD-10-CM

## 2023-07-26 DIAGNOSIS — G959 Disease of spinal cord, unspecified: Secondary | ICD-10-CM | POA: Diagnosis not present

## 2023-07-26 NOTE — Patient Instructions (Signed)
 Please see below for information in regards to your upcoming surgery:   Planned surgery: 1) C3-6 anterior cervical discectomy and fusion (staying overnight)  2) L4-5 decompression (going home same day)   Surgery date: 1) 08/15/23 - neck; 2) 10/17/23 - back at [facility] (Medical Mall: [address] [place], [place] [address]) - you will find out your arrival time the business day before your surgery.   Pre-op appointment at [facility] Pre-admit Testing: we will call you with a date/time for this. If you are scheduled for an in person appointment, Pre-admit Testing is located on the first floor of the [facility] building, [address], [address]. Please bring all prescriptions in the original prescription bottles to your appointment. During this appointment, they will advise you which medications you can take the morning of surgery, and which medications you will need to hold for surgery. Labs (such as blood work, EKG) may be done at your pre-op appointment. You are not required to fast for these labs. Should you need to change your pre-op appointment, please call Pre-admit testing at [phone].     *for both surgeries Blood thinners:  Aspirin 81mg :   because you stated you are taking this as a preventative: stop aspirin 7 days prior, resume aspirin 14 days after    *for both surgeries:Diabetes medications: Per anesthesia guidelines (due to the increased risk of aspiration caused by delayed gastric emptying): Metformin: hold for 2 days prior to surgery; can resume after surgery    Surgical clearance: we will send a clearance form to [employer], PA-C. They may wish to see you in their office prior to signing the clearance form. If so, they may call you to schedule an appointment.     For the neck surgery: Brace: You will need to bring the brace to the hospital on the day of surgery. [facility] will contact you regarding an appointment  for the brace you will use after surgery. If it is getting close to your surgery date and you have not received an appointment with [facility], please reach out to [place].  Their number is [phone] should you miss their call or have an issue with your brace after surgery.     For the neck surgery:NSAIDS (Non-steroidal anti-inflammatory drugs): because you are having a fusion, please avoid taking any NSAIDS (examples: ibuprofen, motrin, aleve, naproxen, meloxicam, diclofenac) for 3 months after surgery. Celebrex is an exception and is OK to take, if prescribed. Tylenol is not an NSAID.    For both surgeries:Common restrictions after surgery: No bending, lifting, or twisting ("BLT"). Avoid lifting objects heavier than 10 pounds for the first 6 weeks after surgery. Where possible, avoid household activities that involve lifting, bending, reaching, pushing, or pulling such as laundry, vacuuming, grocery shopping, and childcare. Try to arrange for help from friends and family for these activities while you heal. Do not drive while taking prescription pain medication. Weeks 6 through 12 after surgery: avoid lifting more than 25 pounds.    For the neck surgery: X-rays after surgery: Because you are having a fusion: for appointments after your 2 week follow-up: please arrive at the [facility] outpatient imaging center ([address], [place]) or [facility] one hour prior to your appointment for x-rays. This applies to every appointment after your 2 week follow-up. Failure to do so may result in your appointment being rescheduled.   How to contact us:  If you have  any questions/concerns before or after surgery, you can reach [place] at [phone], or you can send a mychart message. We can be reached by phone or mychart 8am-4pm, Monday-Friday.  *Please note: Calls after 4pm are forwarded to a third party answering service. Mychart messages are not routinely monitored during  evenings, weekends, and holidays. Please call our office to contact the answering service for urgent concerns during non-business hours.    If you have FMLA/disability paperwork, please drop it off or fax it to [phone], attention Patty.   Appointments/FMLA & disability paperwork: Joycelyn Rua, & Flonnie Hailstone Registered Nurse/Surgery scheduler: Royston Cowper Medical Assistants: Nash Mantis Physician Assistants: Joan Flores, PA-C, Manning Charity, PA-C & Drake Leach, PA-C Surgeons: Venetia Night, MD & Ernestine Mcmurray, MD

## 2023-08-03 ENCOUNTER — Encounter
Admission: RE | Admit: 2023-08-03 | Discharge: 2023-08-03 | Disposition: A | Discharge status: Home / Self Care | Payer: Medicare HMO | Source: Ambulatory Visit | Attending: Neurosurgery | Admitting: Neurosurgery

## 2023-08-03 ENCOUNTER — Emergency Department

## 2023-08-03 ENCOUNTER — Other Ambulatory Visit: Payer: Self-pay

## 2023-08-03 ENCOUNTER — Emergency Department
Admission: EM | Admit: 2023-08-03 | Discharge: 2023-08-03 | Disposition: A | Discharge status: Home / Self Care | Attending: Emergency Medicine | Admitting: Emergency Medicine

## 2023-08-03 DIAGNOSIS — I10 Essential (primary) hypertension: Secondary | ICD-10-CM | POA: Insufficient documentation

## 2023-08-03 DIAGNOSIS — R42 Dizziness and giddiness: Secondary | ICD-10-CM | POA: Diagnosis present

## 2023-08-03 DIAGNOSIS — R202 Paresthesia of skin: Secondary | ICD-10-CM | POA: Diagnosis not present

## 2023-08-03 HISTORY — DX: Hyperlipidemia, unspecified: E78.5

## 2023-08-03 LAB — BASIC METABOLIC PANEL
Anion gap: 8 (ref 5–15)
BUN: 16 mg/dL (ref 8–23)
CO2: 26 mmol/L (ref 22–32)
Calcium: 9.4 mg/dL (ref 8.9–10.3)
Chloride: 108 mmol/L (ref 98–111)
Creatinine, Ser: 0.68 mg/dL (ref 0.44–1.00)
GFR, Estimated: >60 mL/min (ref >60)
Glucose, Bld: 100 mg/dL — ABNORMAL HIGH (ref 70–99)
Potassium: 4.3 mmol/L (ref 3.5–5.1)
Sodium: 142 mmol/L (ref 135–145)

## 2023-08-03 LAB — CBC
HCT: 42.1 % (ref 36.0–46.0)
Hemoglobin: 13.3 g/dL (ref 12.0–15.0)
MCH: 28.7 pg (ref 26.0–34.0)
MCHC: 31.6 g/dL (ref 30.0–36.0)
MCV: 90.9 fL (ref 80.0–100.0)
Platelets: 237 10*3/uL (ref 150–400)
RBC: 4.63 MIL/uL (ref 3.87–5.11)
RDW: 13.1 % (ref 11.5–15.5)
WBC: 4.9 10*3/uL (ref 4.0–10.5)
nRBC: 0 % (ref 0.0–0.2)

## 2023-08-03 MED ORDER — ACETAMINOPHEN 500 MG PO TABS
1000.0000 mg | ORAL_TABLET | Freq: Once | ORAL | Status: AC
  Administered 2023-08-03: 1000 mg via ORAL
  Filled 2023-08-03: qty 2

## 2023-08-03 MED ORDER — IOHEXOL 350 MG/ML SOLN
75.0000 mL | Freq: Once | INTRAVENOUS | Status: AC | PRN
  Administered 2023-08-03: 75 mL via INTRAVENOUS

## 2023-08-03 MED ORDER — MECLIZINE HCL 25 MG PO TABS
25.0000 mg | ORAL_TABLET | Freq: Once | ORAL | Status: AC
  Administered 2023-08-03: 25 mg via ORAL
  Filled 2023-08-03: qty 1

## 2023-08-03 MED ORDER — MECLIZINE HCL 25 MG PO TABS: 25.0000 mg | ORAL_TABLET | Freq: Three times a day (TID) | ORAL | 0 refills | Status: DC | PRN

## 2023-08-03 NOTE — ED Triage Notes (Signed)
 Pt to ED for dizziness started when got out of car this am, worsening with walking and movement.

## 2023-08-03 NOTE — Patient Instructions (Addendum)
 Your procedure is scheduled on: August 15, 2023 Wednesday Report to the Registration Desk on the 1st floor of the [facility] To find out your arrival time, please call [phone] between 1PM - 3PM on: March 18/2025 Tuesday If your arrival time is 6:00 am, do not arrive before that time as the [facility] entrance doors do not open until 6:00 am.  REMEMBER: Instructions that are not followed completely may result in serious medical risk, up to and including death; or upon the discretion of your surgeon and anesthesiologist your surgery may need to be rescheduled.  Do not eat food after midnight the night before surgery.  No gum chewing or hard candies.  You may however, drink CLEAR liquids up to 2 hours before you are scheduled to arrive for your surgery. Do not drink anything within 2 hours of your scheduled arrival time.  Clear liquids include: - water  - apple juice without pulp - gatorade (not RED colors) - black coffee or tea (Do NOT add milk or creamers to the coffee or tea) Do NOT drink anything that is not on this list.  **Type 1 and Type 2 diabetics should only drink water.**  Hold metformin two days prior to surgery     One week prior to surgery: Stop Anti-inflammatories (NSAIDS) such as Advil, Aleve, Ibuprofen, Motrin, Naproxen, Naprosyn and Aspirin based products such as Excedrin, Goody's Powder, BC Powder. Stop ANY OVER THE COUNTER supplements until after surgery.  You may however, continue to take Tylenol if needed for pain up until the day of surgery.  **Follow guidelines for insulin and diabetes medications.**  **Follow recommendations regarding stopping blood thinners.**  Continue taking all of your other prescription medications up until the day of surgery.  ON THE DAY OF SURGERY ONLY TAKE THESE MEDICATIONS WITH SIPS OF WATER:    Use inhalers on the day of surgery and bring to the hospital.  Fleets enema or bowel prep as directed.  No Alcohol for  24 hours before or after surgery.  No Smoking including e-cigarettes for 24 hours before surgery.  No chewable tobacco products for at least 6 hours before surgery.  No nicotine patches on the day of surgery.  Do not use any "recreational" drugs for at least a week (preferably 2 weeks) before your surgery.  Please be advised that the combination of cocaine and anesthesia may have negative outcomes, up to and including death. If you test positive for cocaine, your surgery will be cancelled.  On the morning of surgery brush your teeth with toothpaste and water, you may rinse your mouth with mouthwash if you wish. Do not swallow any toothpaste or mouthwash.  Use CHG Soap or wipes as directed on instruction sheet.  Do not wear jewelry, make-up, hairpins, clips or nail polish.  For welded (permanent) jewelry: bracelets, anklets, waist bands, etc.  Please have this removed prior to surgery.  If it is not removed, there is a chance that hospital personnel will need to cut it off on the day of surgery.  Do not wear lotions, powders, or perfumes.   Do not shave body hair from the neck down 48 hours before surgery.  Contact lenses, hearing aids and dentures may not be worn into surgery.  Do not bring valuables to the hospital. [facility] is not responsible for any missing/lost belongings or valuables.   Total Shoulder Arthroplasty:  use Benzoyl Peroxide 5% Gel as directed on instruction sheet.  Bring your C-PAP to the hospital  in case you may have to spend the night.   Notify your doctor if there is any change in your medical condition (cold, fever, infection).  Wear comfortable clothing (specific to your surgery type) to the hospital.  After surgery, you can help prevent lung complications by doing breathing exercises.  Take deep breaths and cough every 1-2 hours. Your doctor may order a device called an Incentive Spirometer to help you take deep breaths. When coughing or sneezing, hold  a pillow firmly against your incision with both hands. This is called "splinting." Doing this helps protect your incision. It also decreases belly discomfort.  If you are being admitted to the hospital overnight, leave your suitcase in the car. After surgery it may be brought to your room.  In case of increased patient census, it may be necessary for you, the patient, to continue your postoperative care in the Same Day Surgery department.  If you are being discharged the day of surgery, you will not be allowed to drive home. You will need a responsible individual to drive you home and stay with you for 24 hours after surgery.   If you are taking public transportation, you will need to have a responsible individual with you.  Please call the Pre-admissions Testing Dept. at [phone] if you have any questions about these instructions.  Surgery Visitation Policy:  Patients having surgery or a procedure may have two visitors.  Children under the age of 52 must have an adult with them who is not the patient.  Temporary Visitor Restrictions Due to increasing cases of flu, RSV and COVID-19: Children ages 57 and under will not be able to visit patients in [facility] hospitals under most circumstances.  Inpatient Visitation:    Visiting hours are 7 a.m. to 8 p.m. Up to four visitors are allowed at one time in a patient room. The visitors may rotate out with other people during the day.  One visitor age 98 or older may stay with the patient overnight and must be in the room by 8 p.m.      Pre-operative 5 CHG Bath Instructions   You can play a key role in reducing the risk of infection after surgery. Your skin needs to be as free of germs as possible. You can reduce the number of germs on your skin by washing with CHG (chlorhexidine gluconate) soap before surgery. CHG is an antiseptic soap that kills germs and continues to kill germs even after washing.   DO NOT use if you have an  allergy to chlorhexidine/CHG or antibacterial soaps. If your skin becomes reddened or irritated, stop using the CHG and notify one of our RNs at [phone].   Please shower with the CHG soap starting 4 days before surgery using the following schedule:       Please keep in mind the following:  DO NOT shave, including legs and underarms, starting the day of your first shower.   You may shave your face at any point before/day of surgery.  Place clean sheets on your bed the day you start using CHG soap. Use a clean washcloth (not used since being washed) for each shower. DO NOT sleep with pets once you start using the CHG.   CHG Shower Instructions:  If you choose to wash your hair and private area, wash first with your normal shampoo/soap.  After you use shampoo/soap, rinse your hair and body thoroughly to remove shampoo/soap residue.  Turn the water OFF and apply  about 3 tablespoons (45 ml) of CHG soap to a CLEAN washcloth.  Apply CHG soap ONLY FROM YOUR NECK DOWN TO YOUR TOES (washing for 3-5 minutes)  DO NOT use CHG soap on face, private areas, open wounds, or sores.  Pay special attention to the area where your surgery is being performed.  If you are having back surgery, having someone wash your back for you may be helpful. Wait 2 minutes after CHG soap is applied, then you may rinse off the CHG soap.  Pat dry with a clean towel  Put on clean clothes/pajamas   If you choose to wear lotion, please use ONLY the CHG-compatible lotions on the back of this paper.     Additional instructions for the day of surgery: DO NOT APPLY any lotions, deodorants, cologne, or perfumes.   Put on clean/comfortable clothes.  Brush your teeth.  Ask your nurse before applying any prescription medications to the skin.      CHG Compatible Lotions   Aveeno Moisturizing lotion  Cetaphil Moisturizing Cream  Cetaphil Moisturizing Lotion  Clairol Herbal Essence Moisturizing Lotion, Dry Skin  Clairol  Herbal Essence Moisturizing Lotion, Extra Dry Skin  Clairol Herbal Essence Moisturizing Lotion, Normal Skin  Curel Age Defying Therapeutic Moisturizing Lotion with Alpha Hydroxy  Curel Extreme Care Body Lotion  Curel Soothing Hands Moisturizing Hand Lotion  Curel Therapeutic Moisturizing Cream, Fragrance-Free  Curel Therapeutic Moisturizing Lotion, Fragrance-Free  Curel Therapeutic Moisturizing Lotion, Original Formula  Eucerin Daily Replenishing Lotion  Eucerin Dry Skin Therapy Plus Alpha Hydroxy Crme  Eucerin Dry Skin Therapy Plus Alpha Hydroxy Lotion  Eucerin Original Crme  Eucerin Original Lotion  Eucerin Plus Crme Eucerin Plus Lotion  Eucerin TriLipid Replenishing Lotion  Keri Anti-Bacterial Hand Lotion  Keri Deep Conditioning Original Lotion Dry Skin Formula Softly Scented  Keri Deep Conditioning Original Lotion, Fragrance Free Sensitive Skin Formula  Keri Lotion Fast Absorbing Fragrance Free Sensitive Skin Formula  Keri Lotion Fast Absorbing Softly Scented Dry Skin Formula  Keri Original Lotion  Keri Skin Renewal Lotion Keri Silky Smooth Lotion  Keri Silky Smooth Sensitive Skin Lotion  Nivea Body Creamy Conditioning Oil  Nivea Body Extra Enriched Teacher, adult education Moisturizing Lotion Nivea Crme  Nivea Skin Firming Lotion  NutraDerm 30 Skin Lotion  NutraDerm Skin Lotion  NutraDerm Therapeutic Skin Cream  NutraDerm Therapeutic Skin Lotion  ProShield Protective Hand Cream  Provon moisturizing lotion

## 2023-08-03 NOTE — Discharge Instructions (Addendum)
 As we discussed, try the meclizine as needed for any further episodes of spinning, vertigo or dizziness.  You can take up to 3 times per day as needed for these spells, but do not have to take this medication if you are feeling okay.  If you continue to have these episodes, please reach out to the ENT doctor to see them in the clinic  If you develop new or worsening symptoms that do not improve with these medications then please return to the ED

## 2023-08-03 NOTE — ED Provider Notes (Signed)
 [facility] Provider Note    Event Date/Time   First MD Initiated Contact with Patient 08/03/23 1007     (approximate)   History   Dizziness   HPI  Meagan Carr is a 68 y.o. female who presents to the ED for evaluation of Dizziness   Review an outpatient neurosurgical consult from last week.  She history of HTN, cervical myelopathy as well as lumbar stenosis.  Upcoming outpatient anterior cervical discectomy and fusion later this month.,  Lumbar decompression planned for May.  Patient presents to the ED for evaluation of dizziness intermittently over the past 2 hours as well as a "feeling weird" sensation on the right side of her face.  She was coming to the hospital this morning for routine preoperative testing for her cervical procedure later this month.  After getting out of the car to walk into the hospital complex she reports feeling dizziness that she describes as both off balance and feeling like she might pass out.  She told the registration staff of this, she did not fall she was directed to the ED for evaluation.  Reports her dizziness has subsided after being seated  Seemingly separately, she reports a weird sensation on the right side of her face that she has trouble describing it has been present for little bit longer this morning, over the past 1-2 hours.  Reports her hearing, voice and vision feel okay but has trouble describing a sensation change to the right side of her cheek and jaw.  No other sensation changes throughout her body, no weakness to the extremities or gait changes while she was ambulating into the hospital.   Physical Exam   Triage Vital Signs: ED Triage Vitals  Encounter Vitals Group     BP 08/03/23 0958 120/86     Systolic BP Percentile --      Diastolic BP Percentile --      Pulse Rate 08/03/23 0958 85     Resp 08/03/23 0958 16     Temp 08/03/23 0958 98.1 F (36.7 C)     Temp src --      SpO2 08/03/23 0958 96 %      Weight 08/03/23 0959 228 lb (103.4 kg)     Height 08/03/23 0959 5\' 3"  (1.6 m)     Head Circumference --      Peak Flow --      Pain Score 08/03/23 0959 0     Pain Loc --      Pain Education --      Exclude from Growth Chart --     Most recent vital signs: Vitals:   08/03/23 0958  BP: 120/86  Pulse: 85  Resp: 16  Temp: 98.1 F (36.7 C)  SpO2: 96%    General: Awake, no distress.  CV:  Good peripheral perfusion.  Resp:  Normal effort.  Abd:  No distention.  MSK:  No deformity noted.  Neuro:   Other:  Altered sensation to light touch, decreased, to V2 and V3 distributions of trigeminal nerve on the right side of her face.  Strength appears intact throughout the face.  Otherwise no evidence of cranial nerve deficits No evidence of deficits to the extremities, strength or sensation   ED Results / Procedures / Treatments   Labs (all labs ordered are listed, but only abnormal results are displayed) Labs Reviewed  BASIC METABOLIC PANEL - Abnormal; Notable for the following components:      Result Value  Glucose, Bld 100 (*)    All other components within normal limits  CBC    EKG Sinus rhythm with a rate of 78 bpm.  Normal axis and intervals.  No clear signs of acute ischemia.  RADIOLOGY CTA head/neck interpreted by me without evidence of acute pathology  Official radiology report(s): CT ANGIO HEAD NECK W WO CM Result Date: 08/03/2023 CLINICAL DATA:  Dizziness, altered sensation in the right trigeminal V2/V3 distribution. EXAM: CT ANGIOGRAPHY HEAD AND NECK WITH AND WITHOUT CONTRAST TECHNIQUE: Multidetector CT imaging of the head and neck was performed using the standard protocol during bolus administration of intravenous contrast. Multiplanar CT image reconstructions and MIPs were obtained to evaluate the vascular anatomy. Carotid stenosis measurements (when applicable) are obtained utilizing NASCET criteria, using the distal internal carotid diameter as the denominator.  RADIATION DOSE REDUCTION: This exam was performed according to the departmental dose-optimization program which includes automated exposure control, adjustment of the mA and/or kV according to patient size and/or use of iterative reconstruction technique. CONTRAST:  75mL OMNIPAQUE IOHEXOL 350 MG/ML SOLN COMPARISON:  MRI head and CT head 08/26/2020. FINDINGS: CT HEAD FINDINGS Brain: No acute intracranial hemorrhage. No CT evidence of acute infarct. No edema, mass effect, or midline shift. The basilar cisterns are patent. Ventricles: Ventricles are normal in size and configuration. Vascular: No hyperdense vessel. Skull: No acute or aggressive finding. Sinuses/orbits: The visualized paranasal sinuses are clear. Orbits are symmetric. Other: Mastoid air cells are clear. CTA NECK FINDINGS Aortic arch: Standard configuration of the aortic arch. Imaged portion shows no evidence of aneurysm or dissection. No significant stenosis of the major arch vessel origins. Pulmonary arteries: As permitted by contrast timing, there are no filling defects in the visualized pulmonary arteries. Dilated main pulmonary artery measuring up to 3.5 cm in diameter. Subclavian arteries: The subclavian arteries are patent bilaterally. Right carotid system: No evidence of dissection, stenosis (50% or greater), or occlusion. Mild tortuosity of the mid/distal cervical ICA with associated focus of mild narrowing. No dissection flap or luminal irregularity noted. Left carotid system: No evidence of dissection, stenosis (50% or greater), or occlusion. Mild tortuosity of the cervical ICA Vertebral arteries: Codominant. Limited evaluation of the right V1 segment due to adjacent dense venous contrast. Tortuosity of the left V1 segment. No evidence of dissection, stenosis (50% or greater), or occlusion. Skeleton: No acute or aggressive finding noted. Other neck: The visualized airway is patent. No cervical lymphadenopathy. Upper chest: Visualized lung  apices are clear. Review of the MIP images confirms the above findings CTA HEAD FINDINGS ANTERIOR CIRCULATION: The intracranial ICAs are patent bilaterally. No significant stenosis, proximal occlusion, aneurysm, or vascular malformation. MCAs: The middle cerebral arteries are patent bilaterally. ACAs: The anterior cerebral arteries are patent bilaterally. POSTERIOR CIRCULATION: No significant stenosis, proximal occlusion, aneurysm, or vascular malformation. PCAs: The posterior cerebral arteries are patent bilaterally. Pcomm: Not well visualized. SCAs: The superior cerebellar arteries are patent bilaterally. Basilar artery: Patent AICAs: Visualized on the right. PICAs: Patent Vertebral arteries: The intracranial vertebral arteries are patent. Venous sinuses: As permitted by contrast timing, patent. Anatomic variants: None Review of the MIP images confirms the above findings IMPRESSION: CT HEAD: No acute intracranial abnormality. CTA HEAD: No large vessel occlusion, significant stenosis, or vascular malformation. CTA NECK: 1. No hemodynamically significant stenosis. 2. Tortuous bilateral cervical ICAs which may be related to hypertension. 3. Dilated main pulmonary artery measuring up to 3.5 cm in diameter, suggestive of pulmonary hypertension. Electronically Signed   By: Emily Filbert  M.D.   On: 08/03/2023 14:03    PROCEDURES and INTERVENTIONS:  .1-3 Lead EKG Interpretation  Performed by: Delton Prairie, MD Authorized by: Delton Prairie, MD     Interpretation: normal     ECG rate:  80   ECG rate assessment: normal     Rhythm: sinus rhythm     Ectopy: none     Conduction: normal     Medications  meclizine (ANTIVERT) tablet 25 mg (25 mg Oral Given 08/03/23 1034)  acetaminophen (TYLENOL) tablet 1,000 mg (1,000 mg Oral Given 08/03/23 1034)  iohexol (OMNIPAQUE) 350 MG/ML injection 75 mL (75 mLs Intravenous Contrast Given 08/03/23 1226)     IMPRESSION / MDM / ASSESSMENT AND PLAN / ED COURSE  I reviewed the  triage vital signs and the nursing notes.  Differential diagnosis includes, but is not limited to, TIA or carotid ischemia, BPPV, CVA, cardiac dysrhythmia  {Patient presents with symptoms of an acute illness or injury that is potentially life-threatening.  Patient presents to the ED for few hours of intermittent positional dizziness as well as acute maxillary/mandibular paresthesias on the right side.  She has a benign workup and resolving symptoms with meclizine and acetaminophen.  Blood work is benign with a normal CBC and metabolic panel.  CTA neck/head, as above is reassuring.  No evidence of dysrhythmias on the monitor or twelve-lead.  I considered MRI and possibly admission for this patient but considering her improving symptoms with interventions provided I do believe outpatient management would be reasonable.  We discussed close ED return precautions and she is suitable for outpatient management.  Clinical Course as of 08/03/23 1433  Fri Aug 03, 2023  1339 Reassessed, feeling better.  Awaiting CT.  We have had some delays today with radiology reads [DS]  1426 Reassessed.  She reports feeling better and is appreciative.  We discussed reassuring CT imaging and possible etiologies of her symptoms.  Discussed ENT follow-up, ED return precautions and expectant management. [DS]    Clinical Course User Index [DS] Delton Prairie, MD     FINAL CLINICAL IMPRESSION(S) / ED DIAGNOSES   Final diagnoses:  Dizziness  Vertigo  Facial paresthesia     Rx / DC Orders   ED Discharge Orders          Ordered    meclizine (ANTIVERT) 25 MG tablet  3 times daily PRN        08/03/23 1428             Note:  This document was prepared using Dragon voice recognition software and may include unintentional dictation errors.   Delton Prairie, MD 08/03/23 1434

## 2023-08-08 ENCOUNTER — Telehealth: Payer: Self-pay | Admitting: Neurosurgery

## 2023-08-08 NOTE — Telephone Encounter (Addendum)
 Per discussion with Dr Myer Haff, she can have the biopsy at any time before or after surgery and doesn't need to cancel surgery for the biopsy unless there is a concern for cancer.   I spoke with the patient about the above. She reports she is nervous about having neck surgery. She initially requested to cancel her neck surgery and only have back surgery. I explained that I discussed this with Dr Myer Haff and he stated that she cannot have her back surgery until her neck is done b/c she has cervical myelopathy. I further explained this in layman's terms. She then stated she isn't ready and requested to postpone her neck surgery until 10/17/23 and then have her back surgery after that.   I have rescheduled her neck surgery to 5/21 and her back surgery to 7/16.  I will call her back once I confirm these dates with Dr Myer Haff

## 2023-08-08 NOTE — Telephone Encounter (Signed)
 Patient sent an appt request but all the note said was "surgery" I called the patient to clarify what she would like to discuss. She said she has to cancel her surgery because she went to her endocrinologist and they are wanting to do a biopsy on her. 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 She would like to discuss this with the nurse and see what she should do.

## 2023-08-09 ENCOUNTER — Inpatient Hospital Stay: Admit: 2023-08-09

## 2023-08-30 ENCOUNTER — Encounter: Payer: Medicare HMO | Admitting: Orthopedic Surgery

## 2023-09-27 ENCOUNTER — Encounter: Payer: Medicare HMO | Admitting: Neurosurgery

## 2023-10-05 ENCOUNTER — Other Ambulatory Visit: Payer: Self-pay

## 2023-10-05 ENCOUNTER — Encounter
Admission: RE | Admit: 2023-10-05 | Discharge: 2023-10-05 | Disposition: A | Discharge status: Home / Self Care | Source: Ambulatory Visit | Attending: Neurosurgery | Admitting: Neurosurgery

## 2023-10-05 VITALS — BP 152/84 | HR 80 | Resp 16 | Ht 63.0 in | Wt 223.0 lb

## 2023-10-05 DIAGNOSIS — R7303 Prediabetes: Secondary | ICD-10-CM | POA: Diagnosis not present

## 2023-10-05 DIAGNOSIS — E66812 Obesity, class 2: Secondary | ICD-10-CM | POA: Diagnosis not present

## 2023-10-05 DIAGNOSIS — R030 Elevated blood-pressure reading, without diagnosis of hypertension: Secondary | ICD-10-CM

## 2023-10-05 DIAGNOSIS — Z01812 Encounter for preprocedural laboratory examination: Secondary | ICD-10-CM | POA: Insufficient documentation

## 2023-10-05 DIAGNOSIS — Z6835 Body mass index (BMI) 35.0-35.9, adult: Secondary | ICD-10-CM | POA: Diagnosis not present

## 2023-10-05 DIAGNOSIS — Z01818 Encounter for other preprocedural examination: Secondary | ICD-10-CM

## 2023-10-05 HISTORY — DX: Prediabetes: R73.03

## 2023-10-05 HISTORY — DX: Fatty (change of) liver, not elsewhere classified: K76.0

## 2023-10-05 HISTORY — DX: Pneumonia, unspecified organism: J18.9

## 2023-10-05 LAB — BASIC METABOLIC PANEL WITH GFR
Anion gap: 8 (ref 5–15)
BUN: 10 mg/dL (ref 8–23)
CO2: 27 mmol/L (ref 22–32)
Calcium: 9.4 mg/dL (ref 8.9–10.3)
Chloride: 106 mmol/L (ref 98–111)
Creatinine, Ser: 0.65 mg/dL (ref 0.44–1.00)
GFR, Estimated: >60 mL/min (ref >60)
Glucose, Bld: 102 mg/dL — ABNORMAL HIGH (ref 70–99)
Potassium: 3.5 mmol/L (ref 3.5–5.1)
Sodium: 141 mmol/L (ref 135–145)

## 2023-10-05 LAB — TYPE AND SCREEN
ABO/RH(D): B POS
Antibody Screen: NEGATIVE

## 2023-10-05 LAB — CBC
HCT: 39.8 % (ref 36.0–46.0)
Hemoglobin: 12.7 g/dL (ref 12.0–15.0)
MCH: 28.2 pg (ref 26.0–34.0)
MCHC: 31.9 g/dL (ref 30.0–36.0)
MCV: 88.4 fL (ref 80.0–100.0)
Platelets: 263 10*3/uL (ref 150–400)
RBC: 4.5 MIL/uL (ref 3.87–5.11)
RDW: 12.6 % (ref 11.5–15.5)
WBC: 4.6 10*3/uL (ref 4.0–10.5)
nRBC: 0 % (ref 0.0–0.2)

## 2023-10-05 LAB — SURGICAL PCR SCREEN
MRSA, PCR: NEGATIVE
Staphylococcus aureus: NEGATIVE

## 2023-10-05 NOTE — Patient Instructions (Addendum)
 Your procedure is scheduled on: Wednesday 10/17/23 To find out your arrival time, please call [phone] between 1PM - 3PM on:  Tuesday 10/16/23  Report to the Registration Desk (right side) on the 1st floor of the [facility]. FREE Valet parking is available.  If your arrival time is 6:00 am, do not arrive before that time as the [facility] entrance doors do not open until 6:00 am.  REMEMBER: Instructions that are not followed completely may result in serious medical risk, up to and including death; or upon the discretion of your surgeon and anesthesiologist your surgery may need to be rescheduled.  Do not eat food after midnight the night before surgery.  No gum chewing or hard candies.  You may however, drink CLEAR liquids up to 2 hours before you are scheduled to arrive for your surgery. Do not drink anything within 2 hours of your scheduled arrival time.  Clear liquids include: - water  - apple juice without pulp - gatorade (not RED colors) - black coffee or tea (Do NOT add milk or creamers to the coffee or tea) Do NOT drink anything that is not on this list.  Type 1 and Type 2 diabetics should only drink water.  One week prior to surgery: Stop Anti-inflammatories (NSAIDS) such as Advil , Aleve , Ibuprofen , Motrin , Naproxen , Naprosyn  and Aspirin  based products such as Excedrin, Goody's Powder, BC Powder. You may however, continue to take Tylenol  if needed for pain up until the day of surgery.  Stop ANY OVER THE COUNTER supplements and vitamins until after surgery. Stop on Tuesday 10/09/23  Continue taking all prescribed medications with the exception of the following:  Hold Aspirin  for 7 days, last dose Tuesday 10/09/23. Metformin, last dose Sunday 10/14/23  TAKE ONLY THESE MEDICATIONS THE MORNING OF SURGERY WITH A SIP OF WATER:  pantoprazole (PROTONIX) 40 MG tablet Antacid (take one the night before and one on the morning of surgery - helps to prevent nausea after  surgery.)  No Alcohol for 24 hours before or after surgery.  No Smoking including e-cigarettes for 24 hours before surgery.  No chewable tobacco products for at least 6 hours before surgery.  No nicotine patches on the day of surgery.  Do not use any "recreational" drugs for at least a week (preferably 2 weeks) before your surgery.  Please be advised that the combination of cocaine and anesthesia may have negative outcomes, up to and including death. If you test positive for cocaine, your surgery will be cancelled.  On the morning of surgery brush your teeth with toothpaste and water, you may rinse your mouth with mouthwash if you wish. Do not swallow any toothpaste or mouthwash.  Use CHG Soap or wipes as directed on instruction sheet.  Do not wear lotions, powders, or perfumes. No deodorant on the day of surgery  Do not shave body hair from the neck down 48 hours before surgery.  Wear comfortable clothing (specific to your surgery type) to the hospital.  Do not wear jewelry, make-up, hairpins, clips or nail polish.  For welded (permanent) jewelry: bracelets, anklets, waist bands, etc.  Please have this removed prior to surgery.  If it is not removed, there is a chance that hospital personnel will need to cut it off on the day of surgery. Contact lenses, hearing aids and dentures may not be worn into surgery.  Do not bring valuables to the hospital. [facility] is not responsible for any missing/lost belongings or valuables.   Notify your  doctor if there is any change in your medical condition (cold, fever, infection).  If you are being discharged the day of surgery, you will not be allowed to drive home. You will need a responsible individual to drive you home and stay with you for 24 hours after surgery.   If you are taking public transportation, you will need to have a responsible individual with you.  If you are being admitted to the hospital overnight, leave your suitcase in  the car. After surgery it may be brought to your room.  In case of increased patient census, it may be necessary for you, the patient, to continue your postoperative care in the Same Day Surgery department.  After surgery, you can help prevent lung complications by doing breathing exercises.  Take deep breaths and cough every 1-2 hours. Your doctor may order a device called an Incentive Spirometer to help you take deep breaths.  Surgery Visitation Policy:  Patients undergoing a surgery or procedure may have two family members or support persons with them as long as the person is not COVID-19 positive or experiencing its symptoms.   Inpatient Visitation:    Visiting hours are 7 a.m. to 8 p.m. Up to four visitors are allowed at one time in a patient room. The visitors may rotate out with other people during the day. One designated support person (adult) may remain overnight.  Please call the Pre-admissions Testing Dept. at [phone] if you have any questions about these instructions.    Pre-operative 5 CHG Bath Instructions   You can play a key role in reducing the risk of infection after surgery. Your skin needs to be as free of germs as possible. You can reduce the number of germs on your skin by washing with CHG (chlorhexidine gluconate) soap before surgery. CHG is an antiseptic soap that kills germs and continues to kill germs even after washing.   DO NOT use if you have an allergy to chlorhexidine/CHG or antibacterial soaps. If your skin becomes reddened or irritated, stop using the CHG and notify one of our RNs at [phone].   Please shower with the CHG soap starting 4 days before surgery using the following schedule:   Saturday 10/13/23 - Wednesday 10/17/23    Please keep in mind the following:  DO NOT shave, including legs and underarms, starting the day of your first shower.   You may shave your face at any point before/day of surgery.  Place clean sheets on your bed  the day you start using CHG soap. Use a clean washcloth (not used since being washed) for each shower. DO NOT sleep with pets once you start using the CHG.   CHG Shower Instructions:  If you choose to wash your hair and private area, wash first with your normal shampoo/soap.  After you use shampoo/soap, rinse your hair and body thoroughly to remove shampoo/soap residue.  Turn the water OFF and apply about 3 tablespoons (45 ml) of CHG soap to a CLEAN washcloth.  Apply CHG soap ONLY FROM YOUR NECK DOWN TO YOUR TOES (washing for 3-5 minutes)  DO NOT use CHG soap on face, private areas, open wounds, or sores.  Pay special attention to the area where your surgery is being performed.  If you are having back surgery, having someone wash your back for you may be helpful. Wait 2 minutes after CHG soap is applied, then you may rinse off the CHG soap.  Pat dry with a clean towel  Put on clean clothes/pajamas   If you choose to wear lotion, please use ONLY the CHG-compatible lotions on the back of this paper.     Additional instructions for the day of surgery: DO NOT APPLY any lotions, deodorants, cologne, or perfumes.   Put on clean/comfortable clothes.  Brush your teeth.  Ask your nurse before applying any prescription medications to the skin.      CHG Compatible Lotions   Aveeno Moisturizing lotion  Cetaphil Moisturizing Cream  Cetaphil Moisturizing Lotion  Clairol Herbal Essence Moisturizing Lotion, Dry Skin  Clairol Herbal Essence Moisturizing Lotion, Extra Dry Skin  Clairol Herbal Essence Moisturizing Lotion, Normal Skin  Curel Age Defying Therapeutic Moisturizing Lotion with Alpha Hydroxy  Curel Extreme Care Body Lotion  Curel Soothing Hands Moisturizing Hand Lotion  Curel Therapeutic Moisturizing Cream, Fragrance-Free  Curel Therapeutic Moisturizing Lotion, Fragrance-Free  Curel Therapeutic Moisturizing Lotion, Original Formula  Eucerin Daily Replenishing Lotion  Eucerin Dry  Skin Therapy Plus Alpha Hydroxy Crme  Eucerin Dry Skin Therapy Plus Alpha Hydroxy Lotion  Eucerin Original Crme  Eucerin Original Lotion  Eucerin Plus Crme Eucerin Plus Lotion  Eucerin TriLipid Replenishing Lotion  Keri Anti-Bacterial Hand Lotion  Keri Deep Conditioning Original Lotion Dry Skin Formula Softly Scented  Keri Deep Conditioning Original Lotion, Fragrance Free Sensitive Skin Formula  Keri Lotion Fast Absorbing Fragrance Free Sensitive Skin Formula  Keri Lotion Fast Absorbing Softly Scented Dry Skin Formula  Keri Original Lotion  Keri Skin Renewal Lotion Keri Silky Smooth Lotion  Keri Silky Smooth Sensitive Skin Lotion  Nivea Body Creamy Conditioning Oil  Nivea Body Extra Enriched Teacher, adult education Moisturizing Lotion Nivea Crme  Nivea Skin Firming Lotion  NutraDerm 30 Skin Lotion  NutraDerm Skin Lotion  NutraDerm Therapeutic Skin Cream  NutraDerm Therapeutic Skin Lotion  ProShield Protective Hand Cream  Provon moisturizing lotion

## 2023-10-09 ENCOUNTER — Other Ambulatory Visit: Payer: Medicare HMO

## 2023-10-10 ENCOUNTER — Other Ambulatory Visit: Payer: Self-pay | Admitting: Neurosurgery

## 2023-10-10 ENCOUNTER — Telehealth: Payer: Self-pay | Admitting: Neurosurgery

## 2023-10-10 MED ORDER — METHOCARBAMOL 500 MG PO TABS
500.0000 mg | ORAL_TABLET | Freq: Three times a day (TID) | ORAL | 0 refills | Status: DC | PRN
Start: 2023-10-10 — End: 2023-10-18

## 2023-10-10 NOTE — Telephone Encounter (Signed)
 Meagan Carr passed on the message to patient. No further questions were asked in regards to this medication.

## 2023-10-10 NOTE — Telephone Encounter (Signed)
 C3-6 ACDF on 10/17/23  Patient left a message yesterday after 4pm with the answering service requesting to speak to the nurse. I called patient back. She states that last Thursday she has been having muscle spasms in her neck and in between her shoulder blades. The spasms come and go. Sometimes at night the pain is so strong that she is not able to get any rest. When the spasm is in her right shoulder it feels like someone is stabbing her with a knife. Is there anything she can take or do to help with the pain until her surgery?

## 2023-10-16 ENCOUNTER — Ambulatory Visit (INDEPENDENT_AMBULATORY_CARE_PROVIDER_SITE_OTHER): Admitting: Neurosurgery

## 2023-10-16 ENCOUNTER — Encounter: Payer: Self-pay | Admitting: Neurosurgery

## 2023-10-16 DIAGNOSIS — G959 Disease of spinal cord, unspecified: Secondary | ICD-10-CM

## 2023-10-16 MED ORDER — CHLORHEXIDINE GLUCONATE 0.12 % MT SOLN
15.0000 mL | Freq: Once | OROMUCOSAL | Status: AC
  Administered 2023-10-17: 15 mL via OROMUCOSAL

## 2023-10-16 MED ORDER — LACTATED RINGERS IV SOLN: INTRAVENOUS | Status: DC

## 2023-10-16 MED ORDER — CEFAZOLIN SODIUM-DEXTROSE 2-4 GM/100ML-% IV SOLN
2.0000 g | INTRAVENOUS | Status: AC
Start: 2023-10-17 — End: 2023-10-17
  Administered 2023-10-17: 2 g via INTRAVENOUS

## 2023-10-16 MED ORDER — ORAL CARE MOUTH RINSE: 15.0000 mL | Freq: Once | OROMUCOSAL | Status: AC

## 2023-10-16 MED ORDER — CEFAZOLIN IN SODIUM CHLORIDE 2-0.9 GM/100ML-% IV SOLN
2.0000 g | Freq: Once | INTRAVENOUS | Status: DC
  Filled 2023-10-16: qty 100

## 2023-10-16 NOTE — Progress Notes (Signed)
 In clinic discussion of questions for surgery.  No exam performed.

## 2023-10-16 NOTE — H&P (View-Only) (Signed)
 In clinic discussion of questions for surgery.  No exam performed.

## 2023-10-17 ENCOUNTER — Inpatient Hospital Stay
Admission: RE | Admit: 2023-10-17 | Discharge: 2023-10-18 | DRG: 472 | Disposition: A | Discharge status: Home Health | Payer: Medicare HMO | Attending: Neurosurgery | Admitting: Neurosurgery

## 2023-10-17 ENCOUNTER — Other Ambulatory Visit: Payer: Self-pay

## 2023-10-17 ENCOUNTER — Encounter: Payer: Self-pay | Admitting: Neurosurgery

## 2023-10-17 ENCOUNTER — Inpatient Hospital Stay

## 2023-10-17 ENCOUNTER — Inpatient Hospital Stay: Payer: Self-pay | Admitting: Urgent Care

## 2023-10-17 ENCOUNTER — Encounter
Admission: RE | Disposition: A | Discharge status: Home Health | Payer: Self-pay | Source: Home / Self Care | Attending: Neurosurgery

## 2023-10-17 DIAGNOSIS — M5412 Radiculopathy, cervical region: Secondary | ICD-10-CM | POA: Diagnosis present

## 2023-10-17 DIAGNOSIS — G992 Myelopathy in diseases classified elsewhere: Secondary | ICD-10-CM | POA: Diagnosis present

## 2023-10-17 DIAGNOSIS — Z01818 Encounter for other preprocedural examination: Secondary | ICD-10-CM

## 2023-10-17 DIAGNOSIS — G959 Disease of spinal cord, unspecified: Secondary | ICD-10-CM | POA: Diagnosis not present

## 2023-10-17 DIAGNOSIS — G988 Other disorders of nervous system: Secondary | ICD-10-CM | POA: Diagnosis not present

## 2023-10-17 DIAGNOSIS — M4802 Spinal stenosis, cervical region: Principal | ICD-10-CM

## 2023-10-17 DIAGNOSIS — Z981 Arthrodesis status: Principal | ICD-10-CM

## 2023-10-17 DIAGNOSIS — G9609 Other spinal cerebrospinal fluid leak: Secondary | ICD-10-CM | POA: Diagnosis not present

## 2023-10-17 HISTORY — PX: ANTERIOR CERVICAL DECOMP/DISCECTOMY FUSION: SHX1161

## 2023-10-17 SURGERY — ANTERIOR CERVICAL DECOMPRESSION/DISCECTOMY FUSION 3 LEVELS
Anesthesia: General | Site: Spine Cervical

## 2023-10-17 MED ORDER — ACETAMINOPHEN 500 MG PO TABS
1000.0000 mg | ORAL_TABLET | Freq: Four times a day (QID) | ORAL | Status: DC
  Administered 2023-10-17 – 2023-10-18 (×4): 1000 mg via ORAL
  Filled 2023-10-17 (×3): qty 2

## 2023-10-17 MED ORDER — BUPIVACAINE-EPINEPHRINE (PF) 0.5% -1:200000 IJ SOLN
INTRAMUSCULAR | Status: DC | PRN
  Administered 2023-10-17: 10 mL via PERINEURAL

## 2023-10-17 MED ORDER — PHENOL 1.4 % MT LIQD: 1.0000 | OROMUCOSAL | Status: DC | PRN

## 2023-10-17 MED ORDER — ACETAMINOPHEN 325 MG PO TABS: 650.0000 mg | ORAL_TABLET | ORAL | Status: DC | PRN

## 2023-10-17 MED ORDER — LIDOCAINE HCL (PF) 2 % IJ SOLN
INTRAMUSCULAR | Status: AC
Start: 2023-10-17 — End: ?
  Filled 2023-10-17: qty 5

## 2023-10-17 MED ORDER — FIBRIN SEALANT 2 ML SINGLE DOSE KIT
PACK | CUTANEOUS | Status: AC
  Filled 2023-10-17: qty 2

## 2023-10-17 MED ORDER — GABAPENTIN 400 MG PO CAPS
900.0000 mg | ORAL_CAPSULE | Freq: Every day | ORAL | Status: DC
  Administered 2023-10-17: 900 mg via ORAL

## 2023-10-17 MED ORDER — PHENYLEPHRINE 80 MCG/ML (10ML) SYRINGE FOR IV PUSH (FOR BLOOD PRESSURE SUPPORT)
PREFILLED_SYRINGE | INTRAVENOUS | Status: DC | PRN
  Administered 2023-10-17 (×2): 80 ug via INTRAVENOUS

## 2023-10-17 MED ORDER — FENTANYL CITRATE (PF) 100 MCG/2ML IJ SOLN
INTRAMUSCULAR | Status: AC
  Filled 2023-10-17: qty 2

## 2023-10-17 MED ORDER — SUCCINYLCHOLINE CHLORIDE 200 MG/10ML IV SOSY
PREFILLED_SYRINGE | INTRAVENOUS | Status: AC
  Filled 2023-10-17: qty 10

## 2023-10-17 MED ORDER — OXYCODONE HCL 5 MG PO TABS
ORAL_TABLET | ORAL | Status: AC
  Filled 2023-10-17: qty 2

## 2023-10-17 MED ORDER — LOSARTAN POTASSIUM 50 MG PO TABS
50.0000 mg | ORAL_TABLET | Freq: Every day | ORAL | Status: DC
  Administered 2023-10-17: 50 mg via ORAL

## 2023-10-17 MED ORDER — KETOROLAC TROMETHAMINE 15 MG/ML IJ SOLN
INTRAMUSCULAR | Status: AC
  Filled 2023-10-17: qty 1

## 2023-10-17 MED ORDER — MIDAZOLAM HCL 2 MG/2ML IJ SOLN
INTRAMUSCULAR | Status: AC
  Filled 2023-10-17: qty 2

## 2023-10-17 MED ORDER — 0.9 % SODIUM CHLORIDE (POUR BTL) OPTIME
TOPICAL | Status: DC | PRN
  Administered 2023-10-17: 500 mL

## 2023-10-17 MED ORDER — METHOCARBAMOL 500 MG PO TABS: 500.0000 mg | ORAL_TABLET | Freq: Four times a day (QID) | ORAL | Status: DC | PRN

## 2023-10-17 MED ORDER — SODIUM CHLORIDE 0.9% FLUSH
3.0000 mL | Freq: Two times a day (BID) | INTRAVENOUS | Status: DC
  Administered 2023-10-18 (×2): 3 mL via INTRAVENOUS

## 2023-10-17 MED ORDER — ONDANSETRON HCL 4 MG/2ML IJ SOLN
INTRAMUSCULAR | Status: DC | PRN
  Administered 2023-10-17: 4 mg via INTRAVENOUS

## 2023-10-17 MED ORDER — DEXAMETHASONE SODIUM PHOSPHATE 10 MG/ML IJ SOLN
INTRAMUSCULAR | Status: DC | PRN
  Administered 2023-10-17: 5 mg via INTRAVENOUS

## 2023-10-17 MED ORDER — LIDOCAINE HCL (CARDIAC) PF 100 MG/5ML IV SOSY
PREFILLED_SYRINGE | INTRAVENOUS | Status: DC | PRN
  Administered 2023-10-17: 100 mg via INTRAVENOUS

## 2023-10-17 MED ORDER — CEFAZOLIN SODIUM-DEXTROSE 2-4 GM/100ML-% IV SOLN
INTRAVENOUS | Status: AC
  Filled 2023-10-17: qty 100

## 2023-10-17 MED ORDER — MORPHINE SULFATE (PF) 2 MG/ML IV SOLN: 1.0000 mg | INTRAVENOUS | Status: AC | PRN

## 2023-10-17 MED ORDER — PROPOFOL 1000 MG/100ML IV EMUL
INTRAVENOUS | Status: AC
  Filled 2023-10-17: qty 100

## 2023-10-17 MED ORDER — DOCUSATE SODIUM 100 MG PO CAPS
100.0000 mg | ORAL_CAPSULE | Freq: Two times a day (BID) | ORAL | Status: DC
  Administered 2023-10-17 – 2023-10-18 (×2): 100 mg via ORAL

## 2023-10-17 MED ORDER — PROPOFOL 10 MG/ML IV BOLUS
INTRAVENOUS | Status: DC | PRN
  Administered 2023-10-17: 50 mg via INTRAVENOUS
  Administered 2023-10-17: 75 ug/kg/min via INTRAVENOUS
  Administered 2023-10-17: 150 mg via INTRAVENOUS
  Administered 2023-10-17: 50 mg via INTRAVENOUS

## 2023-10-17 MED ORDER — METHOCARBAMOL 1000 MG/10ML IJ SOLN
500.0000 mg | Freq: Four times a day (QID) | INTRAMUSCULAR | Status: DC | PRN
  Administered 2023-10-17: 500 mg via INTRAVENOUS

## 2023-10-17 MED ORDER — SURGIFLO WITH THROMBIN (HEMOSTATIC MATRIX KIT) OPTIME
TOPICAL | Status: DC | PRN
  Administered 2023-10-17: 1 via TOPICAL

## 2023-10-17 MED ORDER — GABAPENTIN 300 MG PO CAPS
ORAL_CAPSULE | ORAL | Status: AC
  Filled 2023-10-17: qty 3

## 2023-10-17 MED ORDER — SODIUM CHLORIDE 0.9 % IV SOLN: 250.0000 mL | INTRAVENOUS | Status: DC

## 2023-10-17 MED ORDER — REMIFENTANIL HCL 1 MG IV SOLR
INTRAVENOUS | Status: AC
  Filled 2023-10-17: qty 1000

## 2023-10-17 MED ORDER — OXYCODONE HCL 5 MG/5ML PO SOLN: 5.0000 mg | Freq: Once | ORAL | Status: DC | PRN

## 2023-10-17 MED ORDER — GLYCOPYRROLATE 0.2 MG/ML IJ SOLN
INTRAMUSCULAR | Status: AC
  Filled 2023-10-17: qty 1

## 2023-10-17 MED ORDER — ONDANSETRON HCL 4 MG/2ML IJ SOLN
4.0000 mg | Freq: Four times a day (QID) | INTRAMUSCULAR | Status: DC | PRN
  Administered 2023-10-17: 4 mg via INTRAVENOUS

## 2023-10-17 MED ORDER — ACETAMINOPHEN 10 MG/ML IV SOLN
INTRAVENOUS | Status: AC
  Filled 2023-10-17: qty 100

## 2023-10-17 MED ORDER — SENNA 8.6 MG PO TABS
ORAL_TABLET | ORAL | Status: AC
Start: 2023-10-17 — End: ?
  Filled 2023-10-17: qty 1

## 2023-10-17 MED ORDER — ONDANSETRON HCL 4 MG PO TABS
4.0000 mg | ORAL_TABLET | Freq: Four times a day (QID) | ORAL | Status: DC | PRN
  Filled 2023-10-17: qty 1

## 2023-10-17 MED ORDER — SENNA 8.6 MG PO TABS
1.0000 | ORAL_TABLET | Freq: Two times a day (BID) | ORAL | Status: DC
  Administered 2023-10-17 – 2023-10-18 (×2): 8.6 mg via ORAL
  Filled 2023-10-17: qty 1

## 2023-10-17 MED ORDER — FENTANYL CITRATE (PF) 100 MCG/2ML IJ SOLN
25.0000 ug | INTRAMUSCULAR | Status: DC | PRN
  Administered 2023-10-17: 25 ug via INTRAVENOUS
  Administered 2023-10-17 (×3): 50 ug via INTRAVENOUS

## 2023-10-17 MED ORDER — SORBITOL 70 % SOLN: 30.0000 mL | Freq: Every day | Status: DC | PRN

## 2023-10-17 MED ORDER — METHOCARBAMOL 1000 MG/10ML IJ SOLN
INTRAMUSCULAR | Status: AC
Start: 2023-10-17 — End: ?
  Filled 2023-10-17: qty 10

## 2023-10-17 MED ORDER — SUCCINYLCHOLINE CHLORIDE 200 MG/10ML IV SOSY
PREFILLED_SYRINGE | INTRAVENOUS | Status: DC | PRN
Start: 2023-10-17 — End: 2023-10-17
  Administered 2023-10-17: 100 mg via INTRAVENOUS

## 2023-10-17 MED ORDER — KETOROLAC TROMETHAMINE 15 MG/ML IJ SOLN
7.5000 mg | Freq: Four times a day (QID) | INTRAMUSCULAR | Status: AC
  Administered 2023-10-17 – 2023-10-18 (×4): 7.5 mg via INTRAVENOUS
  Filled 2023-10-17 (×2): qty 1

## 2023-10-17 MED ORDER — SODIUM CHLORIDE 0.9% FLUSH: 3.0000 mL | INTRAVENOUS | Status: DC | PRN

## 2023-10-17 MED ORDER — PANTOPRAZOLE SODIUM 40 MG PO TBEC: 40.0000 mg | DELAYED_RELEASE_TABLET | Freq: Every day | ORAL | Status: DC

## 2023-10-17 MED ORDER — PROPOFOL 10 MG/ML IV BOLUS
INTRAVENOUS | Status: AC
Start: 2023-10-17 — End: ?
  Filled 2023-10-17: qty 20

## 2023-10-17 MED ORDER — CHLORHEXIDINE GLUCONATE 0.12 % MT SOLN
OROMUCOSAL | Status: AC
  Filled 2023-10-17: qty 15

## 2023-10-17 MED ORDER — OXYCODONE HCL 5 MG PO TABS: 5.0000 mg | ORAL_TABLET | Freq: Once | ORAL | Status: DC | PRN

## 2023-10-17 MED ORDER — ACETAMINOPHEN 650 MG RE SUPP
650.0000 mg | RECTAL | Status: DC | PRN
Start: 2023-10-17 — End: 2023-10-18

## 2023-10-17 MED ORDER — ACETAMINOPHEN 500 MG PO TABS
ORAL_TABLET | ORAL | Status: AC
  Filled 2023-10-17: qty 2

## 2023-10-17 MED ORDER — MIDAZOLAM HCL 2 MG/2ML IJ SOLN
INTRAMUSCULAR | Status: DC | PRN
  Administered 2023-10-17: 1 mg via INTRAVENOUS

## 2023-10-17 MED ORDER — ATORVASTATIN CALCIUM 10 MG PO TABS
20.0000 mg | ORAL_TABLET | Freq: Every day | ORAL | Status: DC
  Administered 2023-10-17: 20 mg via ORAL

## 2023-10-17 MED ORDER — FENTANYL CITRATE (PF) 100 MCG/2ML IJ SOLN
INTRAMUSCULAR | Status: DC | PRN
  Administered 2023-10-17 (×2): 50 ug via INTRAVENOUS

## 2023-10-17 MED ORDER — MENTHOL 3 MG MT LOZG: 1.0000 | LOZENGE | OROMUCOSAL | Status: DC | PRN

## 2023-10-17 MED ORDER — LOSARTAN POTASSIUM 50 MG PO TABS
ORAL_TABLET | ORAL | Status: AC
  Filled 2023-10-17: qty 1

## 2023-10-17 MED ORDER — DOCUSATE SODIUM 100 MG PO CAPS
ORAL_CAPSULE | ORAL | Status: AC
  Filled 2023-10-17: qty 1

## 2023-10-17 MED ORDER — OXYCODONE HCL 5 MG PO TABS
10.0000 mg | ORAL_TABLET | ORAL | Status: DC | PRN
  Administered 2023-10-17 (×2): 10 mg via ORAL

## 2023-10-17 MED ORDER — MAGNESIUM CITRATE PO SOLN: 1.0000 | Freq: Once | ORAL | Status: DC | PRN

## 2023-10-17 MED ORDER — POLYETHYLENE GLYCOL 3350 17 G PO PACK: 17.0000 g | PACK | Freq: Every day | ORAL | Status: DC | PRN

## 2023-10-17 MED ORDER — ATORVASTATIN CALCIUM 20 MG PO TABS
ORAL_TABLET | ORAL | Status: AC
  Filled 2023-10-17: qty 1

## 2023-10-17 MED ORDER — METOPROLOL SUCCINATE ER 25 MG PO TB24
25.0000 mg | ORAL_TABLET | Freq: Every day | ORAL | Status: DC
  Administered 2023-10-17: 25 mg via ORAL
  Filled 2023-10-17: qty 1

## 2023-10-17 MED ORDER — ENOXAPARIN SODIUM 40 MG/0.4ML IJ SOSY
40.0000 mg | PREFILLED_SYRINGE | INTRAMUSCULAR | Status: DC
  Administered 2023-10-18: 40 mg via SUBCUTANEOUS
  Filled 2023-10-17: qty 0.4

## 2023-10-17 MED ORDER — OXYCODONE HCL 5 MG PO TABS: 5.0000 mg | ORAL_TABLET | ORAL | Status: DC | PRN

## 2023-10-17 MED ORDER — METFORMIN HCL ER 500 MG PO TB24: 500.0000 mg | ORAL_TABLET | Freq: Every day | ORAL | Status: DC

## 2023-10-17 MED ORDER — KETAMINE HCL 50 MG/5ML IJ SOSY
PREFILLED_SYRINGE | INTRAMUSCULAR | Status: DC | PRN
  Administered 2023-10-17: 20 mg via INTRAVENOUS

## 2023-10-17 MED ORDER — REMIFENTANIL HCL 1 MG IV SOLR
INTRAVENOUS | Status: DC | PRN
  Administered 2023-10-17: .05 ug/kg/min via INTRAVENOUS

## 2023-10-17 MED ORDER — ONDANSETRON HCL 4 MG/2ML IJ SOLN
INTRAMUSCULAR | Status: AC
  Filled 2023-10-17: qty 2

## 2023-10-17 MED ORDER — KETAMINE HCL 50 MG/5ML IJ SOSY
PREFILLED_SYRINGE | INTRAMUSCULAR | Status: AC
  Filled 2023-10-17: qty 5

## 2023-10-17 MED ORDER — FIBRIN SEALANT 2 ML SINGLE DOSE KIT
PACK | CUTANEOUS | Status: DC | PRN
  Administered 2023-10-17: 2 mL via TOPICAL

## 2023-10-17 MED ORDER — ACETAMINOPHEN 10 MG/ML IV SOLN
INTRAVENOUS | Status: DC | PRN
  Administered 2023-10-17: 1000 mg via INTRAVENOUS

## 2023-10-17 SURGICAL SUPPLY — 43 items
ALLOGRAFT BONE FIBER KORE 5 (Bone Implant) IMPLANT
BASIN KIT SINGLE STR (MISCELLANEOUS) ×2 IMPLANT
BUR NEURO DRILL SOFT 3.0X3.8M (BURR) ×2 IMPLANT
DERMABOND ADVANCED .7 DNX12 (GAUZE/BANDAGES/DRESSINGS) ×2 IMPLANT
DRAIN CHANNEL JP 10F RND 20C F (MISCELLANEOUS) IMPLANT
DRAPE C ARM PK CFD 31 SPINE (DRAPES) ×2 IMPLANT
DRAPE LAPAROTOMY 77X122 PED (DRAPES) ×2 IMPLANT
DRAPE MICROSCOPE SPINE 48X150 (DRAPES) ×2 IMPLANT
DRSG TEGADERM 4X4.75 (GAUZE/BANDAGES/DRESSINGS) IMPLANT
ELECTRODE REM PT RTRN 9FT ADLT (ELECTROSURGICAL) ×2 IMPLANT
EVACUATOR SILICONE 100CC (DRAIN) IMPLANT
FEE INTRAOP CADWELL SUPPLY NCS (MISCELLANEOUS) IMPLANT
FEE INTRAOP MONITOR IMPULS NCS (MISCELLANEOUS) IMPLANT
GAUZE SPONGE 2X2 STRL 8-PLY (GAUZE/BANDAGES/DRESSINGS) IMPLANT
GLOVE BIOGEL PI IND STRL 6.5 (GLOVE) ×2 IMPLANT
GLOVE SURG SYN 6.5 ES PF (GLOVE) ×1 IMPLANT
GLOVE SURG SYN 6.5 PF PI (GLOVE) ×2 IMPLANT
GLOVE SURG SYN 8.5 E (GLOVE) ×3 IMPLANT
GLOVE SURG SYN 8.5 PF PI (GLOVE) ×6 IMPLANT
GOWN SRG LRG LVL 4 IMPRV REINF (GOWNS) ×2 IMPLANT
GOWN SRG XL LVL 3 NONREINFORCE (GOWNS) ×2 IMPLANT
GRAFT DURAGEN MATRIX 1WX1L (Tissue) IMPLANT
KIT TURNOVER KIT A (KITS) ×2 IMPLANT
MANIFOLD NEPTUNE II (INSTRUMENTS) ×2 IMPLANT
NDL SAFETY ECLIPSE 18X1.5 (NEEDLE) ×2 IMPLANT
NS IRRIG 500ML POUR BTL (IV SOLUTION) ×2 IMPLANT
PACK LAMINECTOMY ARMC (PACKS) ×2 IMPLANT
PAD ARMBOARD POSITIONER FOAM (MISCELLANEOUS) ×4 IMPLANT
PIN CASPAR 14 (PIN) ×2 IMPLANT
PIN CASPAR 14MM (PIN) ×1 IMPLANT
PLATE ACP 1.9X56 3LVL (Screw) IMPLANT
SCREW ACP 3.5X17 S/D VARIA (Screw) IMPLANT
SPACER C HEDRON 12X14 7M 7D (Spacer) IMPLANT
SPONGE KITTNER 5P (MISCELLANEOUS) ×2 IMPLANT
STAPLER SKIN PROX 35W (STAPLE) IMPLANT
SURGIFLO W/THROMBIN 8M KIT (HEMOSTASIS) ×2 IMPLANT
SUT STRATA 3-0 15 PS-2 (SUTURE) ×2 IMPLANT
SUT VIC AB 3-0 SH 8-18 (SUTURE) ×2 IMPLANT
SUTURE EHLN 3-0 FS-10 30 BLK (SUTURE) IMPLANT
SYR 20ML LL LF (SYRINGE) ×2 IMPLANT
TAPE CLOTH 3X10 WHT NS LF (GAUZE/BANDAGES/DRESSINGS) ×4 IMPLANT
TRAP FLUID SMOKE EVACUATOR (MISCELLANEOUS) ×2 IMPLANT
WATER STERILE IRR 500ML POUR (IV SOLUTION) IMPLANT

## 2023-10-17 NOTE — Transfer of Care (Signed)
 Immediate Anesthesia Transfer of Care Note  Patient: Meagan Carr  Procedure(s) Performed: C3-6 ANTERIOR CERVICAL DISCECTOMY AND FUSION (Spine Cervical)  Patient Location: PACU  Anesthesia Type:General  Level of Consciousness: awake and alert   Airway & Oxygen Therapy: Patient Spontanous Breathing and Patient connected to face mask oxygen  Post-op Assessment: Report given to RN and Post -op Vital signs reviewed and stable  Post vital signs: stable  Last Vitals:  Vitals Value Taken Time  BP 170/114 10/17/23 1500  Temp    Pulse 95 10/17/23 1503  Resp 19 10/17/23 1503  SpO2 98 % 10/17/23 1503  Vitals shown include unfiled device data.  Last Pain:  Vitals:   10/17/23 1029  TempSrc: Tympanic  PainSc: 0-No pain         Complications: No notable events documented.

## 2023-10-17 NOTE — Discharge Instructions (Addendum)
 Your surgeon has performed an operation on your cervical spine (neck) to relieve pressure on the spinal cord and/or nerves. This involved making an incision in the front of your neck and removing one or more of the discs that support your spine. Next, a small piece of bone, a titanium plate, and screws were used to fuse two or more of the vertebrae (bones) together.  The following are instructions to help in your recovery once you have been discharged from the hospital. Even if you feel well, it is important that you follow these activity guidelines. If you do not let your neck heal properly from the surgery, you can increase the chance of return of your symptoms and other complications.  * Do not take anti-inflammatory medications for 3 months after surgery (naproxen  [Aleve ], ibuprofen  [Advil , Motrin ], etc.). These medications can prevent your bones from healing properly.  Celebrex, if prescribed, is ok to take.  *Regarding compression stockings-  Please wear day and night until you are walking a couple hundred feet three times a day.   Activity    No bending, lifting, or twisting ("BLT"). Avoid lifting objects heavier than 10 pounds (gallon milk jug).  Where possible, avoid household activities that involve lifting, bending, reaching, pushing, or pulling such as laundry, vacuuming, grocery shopping, and childcare. Try to arrange for help from friends and family for these activities while your back heals.  Increase physical activity slowly as tolerated.  Taking short walks is encouraged, but avoid strenuous exercise. Do not jog, run, bicycle, lift weights, or participate in any other exercises unless specifically allowed by your doctor.  Talk to your doctor before resuming sexual activity.  You should not drive until cleared by your doctor.  Until released by your doctor, you should not return to work or school.  You should rest at home and let your body heal.   You may shower three days  after your surgery.  After showering, lightly dab your incision dry. Do not take a tub bath or go swimming until approved by your doctor at your follow-up appointment.  If your doctor ordered a cervical collar (neck brace) for you, you should wear it whenever you are out of bed. You may remove it when lying down or sleeping, but you should wear it at all other times. Not all neck surgeries require a cervical collar.  If you smoke, we strongly recommend that you quit.  Smoking has been proven to interfere with normal bone healing and will dramatically reduce the success rate of your surgery. Please contact [facility] (800-QUIT-NOW) and use the resources at www.[facility].com for assistance in stopping smoking.  Surgical Incision   If you have a dressing on your incision, you may remove it two days after your surgery. Keep your incision area clean and dry.  If you have staples or stitches on your incision, you should have a follow up scheduled for removal. If you do not have staples or stitches, you will have steri-strips (small pieces of surgical tape) or Dermabond glue. The steri-strips/glue should begin to peel away within about a week (it is fine if the steri-strips fall off before then). If the strips are still in place one week after your surgery, you may gently remove them.  Diet           You may return to your usual diet. However, you may experience discomfort when swallowing in the first month after your surgery. This is normal. You may find that softer foods are  more comfortable for you to swallow. Be sure to stay hydrated.  When to Contact Us   You may experience pain in your neck and/or pain between your shoulder blades. This is normal and should improve in the next few weeks with the help of pain medication, muscle relaxers, and rest. Some patients report that a warm compress on the back of the neck or between the shoulder blades helps.  However, should you experience any of the  following, contact us  immediately: New numbness or weakness Pain that is progressively getting worse, and is not relieved by your pain medication, muscle relaxers, rest, and warm compresses Bleeding, redness, swelling, pain, or drainage from surgical incision Chills or flu-like symptoms Fever greater than 101.0 F (38.3 C) Inability to eat, drink fluids, or take medications Problems with bowel or bladder functions Difficulty breathing or shortness of breath Warmth, tenderness, or swelling in your calf Contact Information How to contact us :  If you have any questions/concerns before or after surgery, you can reach us  at [phone], or you can send a mychart message. We can be reached by phone or mychart 8am-4pm, Monday-Friday.  *Please note: Calls after 4pm are forwarded to a third party answering service. Mychart messages are not routinely monitored during evenings, weekends, and holidays. Please call our office to contact the answering service for urgent concerns during non-business hours.   [facility] They will call you to set up when they will come to your home for assessment   [facility] [address], [place], [place] [address] Hours:  Open 24 hours Phone: [phone]

## 2023-10-17 NOTE — Plan of Care (Signed)
 Verbalizes understanding of plan of care, discharge instructions and follow up care

## 2023-10-17 NOTE — Anesthesia Procedure Notes (Signed)
 Procedure Name: Intubation Date/Time: 10/17/2023 11:45 AM  Performed by: Wilkins Hardy I, CRNAPre-anesthesia Checklist: Patient identified, Emergency Drugs available, Suction available and Patient being monitored Patient Re-evaluated:Patient Re-evaluated prior to induction Oxygen Delivery Method: Circle system utilized Preoxygenation: Pre-oxygenation with 100% oxygen Induction Type: IV induction Ventilation: Mask ventilation without difficulty Laryngoscope Size: McGrath and 3 Tube type: Oral Tube size: 7.0 mm Number of attempts: 1 Airway Equipment and Method: Stylet and Oral airway Placement Confirmation: ETT inserted through vocal cords under direct vision, positive ETCO2 and breath sounds checked- equal and bilateral Secured at: 20 cm Tube secured with: Tape Dental Injury: Teeth and Oropharynx as per pre-operative assessment

## 2023-10-17 NOTE — Interval H&P Note (Signed)
 History and Physical Interval Note:  10/17/2023 11:02 AM  Meagan Carr  has presented today for surgery, with the diagnosis of G95.9 Cervical myelopathy  M48.02 Cervical spinal stenosis M54.12 Cervical radiculopathy.  The various methods of treatment have been discussed with the patient and family. After consideration of risks, benefits and other options for treatment, the patient has consented to  Procedure(s): C3-6 ANTERIOR CERVICAL DISCECTOMY AND FUSION (N/A) as a surgical intervention.  The patient's history has been reviewed, patient examined, no change in status, stable for surgery.  I have reviewed the patient's chart and labs.  Questions were answered to the patient's satisfaction.    Heart sounds normal no MRG. Chest Clear to Auscultation Bilaterally.   Sione Baumgarten

## 2023-10-17 NOTE — Op Note (Signed)
 Indications: Ms. Shanora Christensen is a 68 y.o. female with G95.9 Cervical myelopathy , M48.02 Cervical spinal stenosis, M54.12 Cervical radiculopathy   Findings: stenosis, successful decompression  Preoperative Diagnosis: G95.9 Cervical myelopathy , M48.02 Cervical spinal stenosis, M54.12 Cervical radiculopathy  Postoperative Diagnosis: same   EBL: 50 ml IVF: see AR Drains: none Disposition: Extubated and Stable to PACU Complications: none  A foley catheter was placed.   Preoperative Note:    Risks of surgery discussed include: infection, bleeding, stroke, coma, death, paralysis, CSF leak, nerve/spinal cord injury, numbness, tingling, weakness, complex regional pain syndrome, recurrent stenosis and/or disc herniation, vascular injury, development of instability, neck/back pain, need for further surgery, persistent symptoms, development of deformity, and the risks of anesthesia. The patient understood these risks and agreed to proceed.  Operative Note:  Operative Procedure: 1. Anterior Cervical Discectomy and Fusion C4-5 including bilateral foraminotomies and end plate preparation  2. Anterior Cervical Discectomy and Fusion C5-6 including bilateral foraminotomies and end plate preparation  3. Anterior Cervical Discectomy and Fusion C3-4 including bilateral foraminotomies and end plate preparation 4. Anterior Spinal Instrumentation C3 to 6 using Globus Xtend 5. Anterior arthrodesis from C3 to C6 with placement of biomechanical devices at C3-4, C4-5, and C5-6 6. Use of the operative microscope 7. Use of intraoperative flouroscopy  PROCEDURE IN DETAIL: After obtaining informed consent, the patient taken to the operating room, placed in supine position, general anesthesia induced.  The patient had a small shoulder roll placed behind their shoulders.  The patient received preop antibiotics and IV Decadron .  The patient had a neck incision outlined, was prepped and draped in usual sterile  fashion. A timeout was performed.  The incision was injected with local anesthetic.   An incision was opened, dissection taken down medial to the carotid artery and jugular vein, lateral to the trachea and esophagus.  The prevertebral fascia identified and a localizing x-ray demonstrated the correct level.  The longus colli were dissected laterally, and self-retaining retractors placed to open the operative field. The microscope was then brought into the field.  With this complete, distractor pins were placed in the vertebral bodies of C3 and C4. The distractor was placed from C3-4, and the annulus at C3-4 was opened using a bovie.  Curettes and pituitary rongeurs used to remove the majority of disk, then the drill was used to remove the posterior osteophyte and begin the foraminotomies. The nerve hook was used to elevate the posterior longitudinal ligament, which was then removed with Kerrison rongeurs. The microblunt nerve hook could be passed out the foramen bilaterally.   Meticulous hemostasis was obtained.    A trial was used to size the disc space. A biomechanical device (Globus Hedron C 7 mm height x 14 mm width x 12 mm depth) was filled with bone-promoting allograft and tapped behind the anterior lip of the vertebral body at C3/4.    The distractor was removed, and the C3 distractor pin removed. Bone wax was used for hemostasis.  A caspar pin was placed at C6, then the distractor placed at C4-6. The annulus C4-5 was opened using a bovie.  Curettes and pituitary rongeurs used to remove the majority of disk, then the drill was used to remove the posterior osteophyte and begin the foraminotomies. The nerve hook was used to elevate the posterior longitudinal ligament, which was then removed with Kerrison rongeurs. The microblunt nerve hook could be passed out the foramen bilaterally.   Meticulous hemostasis was obtained.    A trial  was used to size the disc space. A biomechanical device (Globus Hedron C  7 mm height x 14 mm width x 12 mm depth) was filled with bone-promoting allograft and tapped behind the anterior lip of the vertebral body at C4/5.    The annulus C5-6 was opened using a bovie.  Curettes and pituitary rongeurs used to remove the majority of disk, then the drill was used to remove the posterior osteophyte and begin the foraminotomies. The nerve hook was used to elevate the posterior longitudinal ligament, which was then removed with Kerrison rongeurs. The microblunt nerve hook could be passed out the foramen bilaterally.   Meticulous hemostasis was obtained.    A trial was used to size the disc space. A biomechanical device (Globus Hedron C 7 mm height x 14 mm width x 12 mm depth) was filled with bone-promoting allograft and tapped behind the anterior lip of the vertebral body at C5/6.   The caspar distractor was removed, and bone wax used for hemostasis at each level. The anterior osteophytes were removed.   A separate, four segment, three level plate (56 mm Nuvasive ACP) was chosen.  Two screws placed in the vertebral bodies of all four segments, respectively making sure the screws were behind the locking mechanism.  Final AP and lateral radiographs were taken.  Please note that the plate is not inclusive to the biomechanical devices.  The anchoring mechanism of the plate is completely separate from the biomechanical devices.   With everything in good position, the wound was irrigated copiously with bacitracin-containing solution and meticulous hemostasis obtained.  Wound was closed in 2 layers using interrupted inverted 3-0 Vicryl sutures in the platysma and 3-0 monocryl in the dermis.  The wound was dressed with dermabond, the head of bed at 30 degrees, taken to recovery room in stable condition.  No new postop neurological deficits were identified.  All counts were correct at the end of the case.   Monitoring was used throughout without any changes.  Anastacio Karvonen PA assisted  in the procedure. An assistant was required for this procedure due to the complexity.  The assistant provided assistance in tissue manipulation and suction, and was required for the successful and safe performance of the procedure. I performed the critical portions of the procedure.   Jodeen Munch MD

## 2023-10-17 NOTE — Anesthesia Preprocedure Evaluation (Signed)
 Anesthesia Evaluation  Patient identified by MRN, date of birth, ID band Patient awake    Reviewed: Allergy & Precautions, NPO status , Patient's Chart, lab work & pertinent test results  Airway Mallampati: III  TM Distance: >3 FB Neck ROM: full    Dental  (+) Chipped, Dental Advidsory Given   Pulmonary neg pulmonary ROS   Pulmonary exam normal        Cardiovascular hypertension, negative cardio ROS Normal cardiovascular exam     Neuro/Psych  Neuromuscular disease  negative psych ROS   GI/Hepatic Neg liver ROS,GERD  Medicated and Controlled,,  Endo/Other    Class 3 obesity  Renal/GU      Musculoskeletal   Abdominal   Peds  Hematology negative hematology ROS (+)   Anesthesia Other Findings Past Medical History: No date: Fatty liver No date: GERD (gastroesophageal reflux disease) No date: Hyperlipidemia No date: Hypertension No date: Pneumonia No date: Pre-diabetes No date: Prediabetes 05/29/2006: S/P colonoscopy     Comment:  Normal 05/30/1999: S/P colonoscopy with polypectomy     Comment:  [place], polyps, unsure what type No date: Steatosis of liver  Past Surgical History: No date: ABDOMINAL HYSTERECTOMY No date: APPENDECTOMY No date: c-section     Comment:  X2 03/20/2022: CATARACT EXTRACTION; Left 03/2023: CATARACT EXTRACTION; Right No date: CHOLECYSTECTOMY No date: TEMPORAL ARTERY BIOPSY / LIGATION  BMI    Body Mass Index: 40.57 kg/m      Reproductive/Obstetrics negative OB ROS                             Anesthesia Physical Anesthesia Plan  ASA: 3  Anesthesia Plan: General ETT   Post-op Pain Management:    Induction: Intravenous  PONV Risk Score and Plan: 3 and Ondansetron  and Dexamethasone   Airway Management Planned: Oral ETT  Additional Equipment:   Intra-op Plan:   Post-operative Plan: Extubation in OR  Informed Consent: I have reviewed the  patients History and Physical, chart, labs and discussed the procedure including the risks, benefits and alternatives for the proposed anesthesia with the patient or authorized representative who has indicated his/her understanding and acceptance.     Dental Advisory Given  Plan Discussed with: Anesthesiologist, CRNA and Surgeon  Anesthesia Plan Comments: (Patient consented for risks of anesthesia including but not limited to:  - adverse reactions to medications - damage to eyes, teeth, lips or other oral mucosa - nerve damage due to positioning  - sore throat or hoarseness - Damage to heart, brain, nerves, lungs, other parts of body or loss of life  Patient voiced understanding and assent.)       Anesthesia Quick Evaluation

## 2023-10-18 ENCOUNTER — Other Ambulatory Visit: Payer: Self-pay

## 2023-10-18 ENCOUNTER — Encounter: Payer: Self-pay | Admitting: Neurosurgery

## 2023-10-18 LAB — TROPONIN I (HIGH SENSITIVITY)
Troponin I (High Sensitivity): 7 ng/L (ref <18)
Troponin I (High Sensitivity): 7 ng/L (ref <18)

## 2023-10-18 LAB — ABO/RH: ABO/RH(D): B POS

## 2023-10-18 MED ORDER — SENNA 8.6 MG PO TABS
1.0000 | ORAL_TABLET | Freq: Two times a day (BID) | ORAL | 0 refills | Status: AC | PRN
Start: 2023-10-18 — End: ?
  Filled 2023-10-18: qty 60, 30d supply, fill #0

## 2023-10-18 MED ORDER — DOCUSATE SODIUM 100 MG PO CAPS
ORAL_CAPSULE | ORAL | Status: AC
  Filled 2023-10-18: qty 1

## 2023-10-18 MED ORDER — OXYCODONE HCL 5 MG PO TABS
5.0000 mg | ORAL_TABLET | ORAL | 0 refills | Status: DC | PRN
  Filled 2023-10-18: qty 30, 5d supply, fill #0

## 2023-10-18 MED ORDER — METHOCARBAMOL 500 MG PO TABS
500.0000 mg | ORAL_TABLET | Freq: Four times a day (QID) | ORAL | 0 refills | Status: DC | PRN
  Filled 2023-10-18: qty 120, 30d supply, fill #0

## 2023-10-18 NOTE — Progress Notes (Signed)
 DISCHARGE NOTE:  Pt dc with IV removed and pt dc instructions given. Pt and pt's daughter voices no questions or concerns at this time. Pt 3 in 1 and RW delivered to hospital room. Pt also received medications delivered to hospital room. Pt wheeled down to medical mall entrance by staff and pt's daughter provided transportation.

## 2023-10-18 NOTE — Discharge Summary (Signed)
 Discharge Summary  Patient ID: Meagan Carr MRN: [id number] DOB/AGE: July 31, 1955 68 y.o.  Admit date: 10/17/2023 Discharge date: 10/18/2023  Admission Diagnoses:  G95.9 Cervical myelopathy , M48.02 Cervical spinal stenosis, M54.12 Cervical radiculopathy  Discharge Diagnoses:  Principal Problem:   S/P cervical spinal fusion Active Problems:   Cervical myelopathy (HCC)   Cervical spinal stenosis   Discharged Condition: good  Hospital Course:  Jennife Zaucha is a 68 year old presenting with cervical stenosis and myelopathy with radiculopathy.  She underwent a C3-6 ACDF.  Course was complicated by a CSF leak which was repaired intraoperative.  She did not experience any drainage from her incision or positional headaches postoperatively.  She was admitted overnight for monitoring and therapy evaluation.  She initially experienced some pain into her jaw which raise some concern given her cardiac history.  A high-sensitivity troponin was within normal limits and her symptoms resolved.  On further evaluation by Lauren Ponds it was determined that the symptoms are likely muscle spasms from her incision.  She also had some mild dizziness however this resolved with medication changes.  She is otherwise doing well and deemed appropriate for discharge home with home health services on postop day 1 by therapy.  She was given prescriptions for oxycodone, Robaxin , and senna to take as needed.  Consults: None  Significant Diagnostic Studies: See results reviewed  Treatments: surgery: As above.  Please see separately dictated operative report for further details.  Discharge Exam: Blood pressure (!) 159/86, pulse 64, temperature 98 F (36.7 C), temperature source Oral, resp. rate 16, height 5\' 3"  (1.6 m), weight 103.9 kg, SpO2 95%. AA Ox3 CNI  Strength: Side Biceps Triceps Deltoid Interossei Grip Wrist Ext. Wrist Flex.  R 5 5 5 5 4  4+ 5  L 5 5 5 5 4  4+ 5    Side Iliopsoas Quads Hamstring PF DF EHL   R 5 5 5 5 5 5   L 5 5 5 5 5 5    Incision: c/d/I with dermabond in place There is no significant swelling in the neck. Trachea is midline  Disposition: Discharge disposition: 06-Home-Health Care Svc       Discharge Instructions     Incentive spirometry RT   Complete by: As directed       Allergies as of 10/18/2023       Reactions   Prednisone  Other (See Comments)   migraine   Latex Itching, Rash        Medication List     STOP taking these medications    albuterol  108 (90 Base) MCG/ACT inhaler Commonly known as: VENTOLIN  HFA   fluticasone 50 MCG/ACT nasal spray Commonly known as: FLONASE   furosemide 20 MG tablet Commonly known as: LASIX   meclizine  25 MG tablet Commonly known as: ANTIVERT        TAKE these medications    Aspirin  Low Dose 81 MG tablet Generic drug: aspirin  EC Take 81 mg by mouth at bedtime.   atorvastatin 20 MG tablet Commonly known as: LIPITOR Take 20 mg by mouth at bedtime.   gabapentin  300 MG capsule Commonly known as: Neurontin  Take 1 capsule (300 mg total) by mouth 3 (three) times daily. What changed:  how much to take when to take this   losartan 50 MG tablet Commonly known as: COZAAR Take 50 mg by mouth at bedtime.   metFORMIN 500 MG 24 hr tablet Commonly known as: GLUCOPHAGE-XR Take 500 mg by mouth at bedtime.   methocarbamol  500 MG tablet Commonly known  as: ROBAXIN  Take 1 tablet (500 mg total) by mouth every 6 (six) hours as needed for muscle spasms. What changed: when to take this   metoprolol succinate 25 MG 24 hr tablet Commonly known as: TOPROL-XL Take 25 mg by mouth at bedtime.   multivitamin with minerals Tabs tablet Take 1 tablet by mouth in the morning.   oxyCODONE 5 MG immediate release tablet Commonly known as: Oxy IR/ROXICODONE Take 1 tablet (5 mg total) by mouth every 4 (four) hours as needed for moderate pain (pain score 4-6).   pantoprazole 40 MG tablet Commonly known as: PROTONIX Take  40 mg by mouth at bedtime.   senna 8.6 MG Tabs tablet Commonly known as: SENOKOT Take 1 tablet (8.6 mg total) by mouth 2 (two) times daily as needed for mild constipation.               Durable Medical Equipment  (From admission, onward)           Start     Ordered   10/18/23 1310  For home use only DME Bedside commode  Once       Question:  Patient needs a bedside commode to treat with the following condition  Answer:  Arthrodesis status   10/18/23 1309   10/18/23 1309  For home use only DME Walker rolling  Once       Question Answer Comment  Walker: With 5 Inch Wheels   Patient needs a walker to treat with the following condition Arthrodesis status      10/18/23 1309             Signed: Noble Bateman 10/18/2023, 3:22 PM

## 2023-10-18 NOTE — Progress Notes (Addendum)
 Occupational Therapy Evaluation Patient Details Name: Meagan Carr MRN: [id number] DOB: 12-29-1955 Today's Date: 10/18/2023   History of Present Illness   Meagan Carr is s/p C3-6 ACDF for myelopathy     Clinical Impressions Meagan Carr was seen for OT evaluation this date. Prior to hospital admission, pt was IND in ADLs. Pt lives alone. Pt presents to acute OT demonstrating impaired ADL performance and functional mobility 2/2 generalized weakness (See OT problem list for additional functional deficits). Pt currently requires MIN A for LB/UB dressing. Pt reported dizziness when sitting EOB and standing; closed eyes when standing (due to pain).  Pt ambulated CGA + RW ~10 ft; limited by pt reported dizziness. Pt educated in functional application of cervical precautions, AE/DME for bathing/dressing/ toileting, and home/routines modifications. Pt would benefit from skilled OT services to address noted impairments and functional limitations (see below for any additional details) in order to maximize safety and independence while minimizing falls risk and caregiver burden. Anticipate the need for follow up OT services upon acute hospital DC.      If plan is discharge home, recommend the following:   A little help with walking and/or transfers;A little help with bathing/dressing/bathroom;Assistance with cooking/housework;Assist for transportation     Functional Status Assessment   Patient has had a recent decline in their functional status and demonstrates the ability to make significant improvements in function in a reasonable and predictable amount of time.     Equipment Recommendations   BSC/3in1     Recommendations for Other Services         Precautions/Restrictions   Precautions Precautions: Cervical Recall of Precautions/Restrictions: Intact Restrictions Weight Bearing Restrictions Per Provider Order: No     Mobility Bed Mobility Overal bed mobility: Needs  Assistance Bed Mobility: Supine to Sit     Supine to sit: Supervision          Transfers Overall transfer level: Needs assistance Equipment used: Rolling walker (2 wheels) Transfers: Sit to/from Stand Sit to Stand: Contact guard assist                  Balance Overall balance assessment: Needs assistance Sitting-balance support: No upper extremity supported, Feet supported Sitting balance-Leahy Scale: Fair     Standing balance support: Bilateral upper extremity supported, Reliant on assistive device for balance Standing balance-Leahy Scale: Poor                             ADL either performed or assessed with clinical judgement   ADL Overall ADL's : Needs assistance/impaired                                       General ADL Comments: MIN A for LB/UB dressing     Vision         Perception         Praxis         Pertinent Vitals/Pain Pain Assessment Pain Assessment: 0-10 Pain Score: 7  Pain Location: neck/jaw Pain Descriptors / Indicators: Burning Pain Intervention(s): Limited activity within patient's tolerance, Monitored during session, Patient requesting pain meds-RN notified, Repositioned     Extremity/Trunk Assessment Upper Extremity Assessment Upper Extremity Assessment: LUE deficits/detail LUE Deficits / Details: noted weakness upon MMT; grossly 4/5   Lower Extremity Assessment Lower Extremity Assessment: Generalized weakness       Communication  Communication Communication: No apparent difficulties   Cognition Arousal: Alert Behavior During Therapy: WFL for tasks assessed/performed Cognition: Cognition impaired             OT - Cognition Comments: question medication related cognitive difficulties - repeated verbal cueing needed for hand placement, disorientation, closing eyes when standing (reports related to pain)                 Following commands: Impaired Following commands impaired:  Follows one step commands inconsistently     Cueing  General Comments   Cueing Techniques: Verbal cues;Gestural cues;Tactile cues  Pt mentioned dizziness when sitting/standing; BP 170/86   Exercises     Shoulder Instructions      Home Living Family/patient expects to be discharged to:: Private residence Living Arrangements: Alone Available Help at Discharge: Family;Available 24 hours/day Type of Home: House (Townhome) Home Access: Level entry     Home Layout: Two level;1/2 bath on main level;Bed/bath upstairs     Bathroom Shower/Tub: Producer, television/film/video: Standard     Home Equipment: Grab bars - tub/shower   Additional Comments: plans to sleep downstairs      Prior Functioning/Environment Prior Level of Function : Independent/Modified Independent                    OT Problem List: Decreased strength;Decreased range of motion;Decreased activity tolerance;Impaired balance (sitting and/or standing)   OT Treatment/Interventions: Self-care/ADL training      OT Goals(Current goals can be found in the care plan section)   Acute Rehab OT Goals Patient Stated Goal: to go home OT Goal Formulation: With patient/family Time For Goal Achievement: 11/01/23 Potential to Achieve Goals: Good ADL Goals Pt Will Perform Grooming: with modified independence;standing Pt Will Perform Lower Body Dressing: with supervision;sit to/from stand Pt Will Transfer to Toilet: with modified independence;ambulating;regular height toilet   OT Frequency:  Min 2X/week    Co-evaluation              AM-PAC OT "6 Clicks" Daily Activity     Outcome Measure Help from another person eating meals?: None Help from another person taking care of personal grooming?: A Little Help from another person toileting, which includes using toliet, bedpan, or urinal?: A Little Help from another person bathing (including washing, rinsing, drying)?: A Little Help from another person  to put on and taking off regular upper body clothing?: A Little Help from another person to put on and taking off regular lower body clothing?: A Little 6 Click Score: 19   End of Session Equipment Utilized During Treatment: Gait belt;Rolling walker (2 wheels) Nurse Communication: Patient requests pain meds  Activity Tolerance: Patient tolerated treatment well Patient left: in chair;with call bell/phone within reach;with family/visitor present  OT Visit Diagnosis: Unsteadiness on feet (R26.81);Other abnormalities of gait and mobility (R26.89);Muscle weakness (generalized) (M62.81)                Time: 0930-1010 OT Time Calculation (min): 40 min Charges:  OT General Charges $OT Visit: 1 Visit OT Evaluation $OT Eval Moderate Complexity: 1 Mod OT Treatments $Self Care/Home Management : 23-37 mins  Stevenson Elbe, Student OT   [employer] 10/18/2023, 11:21 AM

## 2023-10-18 NOTE — Anesthesia Postprocedure Evaluation (Signed)
 Anesthesia Post Note  Patient: Meagan Carr  Procedure(s) Performed: C3-6 ANTERIOR CERVICAL DISCECTOMY AND FUSION (Spine Cervical)  Patient location during evaluation: PACU Anesthesia Type: General Level of consciousness: awake and alert Pain management: pain level controlled Vital Signs Assessment: post-procedure vital signs reviewed and stable Respiratory status: spontaneous breathing, nonlabored ventilation and respiratory function stable Cardiovascular status: blood pressure returned to baseline and stable Postop Assessment: no apparent nausea or vomiting Anesthetic complications: no   No notable events documented.   Last Vitals:  Vitals:   10/17/23 2335 10/18/23 0500  BP: (!) 171/89 135/84  Pulse: 78 77  Resp: 14 16  Temp: 36.5 C (!) 36.4 C  SpO2: 94% 93%    Last Pain:  Vitals:   10/18/23 0615  TempSrc:   PainSc: 7                  Baltazar Bonier

## 2023-10-18 NOTE — TOC Initial Note (Signed)
 Transition of Care [facility]) - Initial/Assessment Note    Patient Details  Name: Meagan Carr MRN: [id number] Date of Birth: 07-Dec-1955  Transition of [facility]) CM/SW Contact:    Alexandra Ice, RN Phone Number: 10/18/2023, 1:12 PM  Clinical Narrative:                  Met with patient and daughter at bedside. Discussed home health options. She is agreeable and has no preference on agency to use. She has no equipment at home, and will need RW and BSC at discharge. She does care for her 11yo granddaughter at home, but has someone assisting with her while she recovers. Her daughter is able to assist for few days but will return to [place] as she has 2 children to tend too.  Sent referral for DME to Jon with [employer] for processing. Sent home health referral to [place].  Expected Discharge Plan: Home w Home Health Services Barriers to Discharge: Continued Medical Work up   Patient Goals and CMS Choice Patient states their goals for this hospitalization and ongoing recovery are:: go home and get better CMS Medicare.gov Compare Post Acute Care list provided to:: Patient Choice offered to / list presented to : Patient      Expected Discharge Plan and Services   Discharge Planning Services: CM Consult Post Acute Care Choice: Home Health, Durable Medical Equipment Living arrangements for the past 2 months: Single Family Home                 DME Arranged: Bedside commode DME Agency: [employer] Date DME Agency Contacted: 10/18/23 Time DME Agency Contacted: 1310 Representative spoke with at DME Agency: Sam Creighton HH Arranged: PT, OT          Prior Living Arrangements/Services Living arrangements for the past 2 months: Single Family Home Lives with:: Minor Children   Do you feel safe going back to the place where you live?: Yes      Need for Family Participation in Patient Care: No (Comment) Care giver support system in place?: Yes (comment)   Criminal Activity/Legal Involvement  Pertinent to Current Situation/Hospitalization: No - Comment as needed  Activities of Daily Living   ADL Screening (condition at time of admission) Independently performs ADLs?: Yes (appropriate for developmental age) Is the patient deaf or have difficulty hearing?: No Does the patient have difficulty seeing, even when wearing glasses/contacts?: No Does the patient have difficulty concentrating, remembering, or making decisions?: No  Permission Sought/Granted                  Emotional Assessment Appearance:: Appears stated age Attitude/Demeanor/Rapport: Engaged Affect (typically observed): Accepting, Pleasant, Appropriate Orientation: : Oriented to Self, Oriented to Place, Oriented to  Time, Oriented to Situation Alcohol / Substance Use: Not Applicable Psych Involvement: No (comment)  Admission diagnosis:  S/P cervical spinal fusion [Z98.1] Patient Active Problem List   Diagnosis Date Noted   Cervical myelopathy (HCC) 10/17/2023   Cervical spinal stenosis 10/17/2023   S/P cervical spinal fusion 10/17/2023   Acute right ankle pain 03/03/2021   Multinodular goiter 10/16/2020   Abnormal chest x-ray 08/12/2020   Blurring of visual image 08/12/2020   Acute lymphadenitis of face, head and neck 08/12/2020   Cervical radiculopathy 08/12/2020   Chronic sinusitis 08/12/2020   Constipation 08/12/2020   Dyspnea 08/12/2020   Elevated blood-pressure reading without diagnosis of hypertension 08/12/2020   Gastroesophageal reflux disease without esophagitis 08/12/2020   Left lower lobe pneumonia 08/12/2020  Paresthesia of upper extremity 08/12/2020   Peroneal tendinitis 08/12/2020   Pneumonia 08/12/2020   Prediabetes 08/12/2020   Skin lesion 08/12/2020   Steatosis of liver 08/12/2020   Visual disturbance 08/12/2020   Abnormal weight gain 07/13/2016   Obesity, Class II, BMI 35-39.9 07/13/2016   TIA (transient ischemic attack) 12/11/2015   Right carpal tunnel syndrome 08/06/2015    Abdominal pain 09/13/2010   Melena 09/13/2010   PCP:  Rosezena Contes, PA-C Pharmacy:   [id number] - [place], [place] - [address] AT [facility] [address] [facility] [address] & [address] [place] [address] Phone: [phone] Fax: [phone]     Social Drivers of Health (SDOH) Social History: SDOH Screenings   Food Insecurity: No Food Insecurity (10/17/2023)  Housing: Low Risk  (10/17/2023)  Transportation Needs: No Transportation Needs (10/17/2023)  Utilities: Not At Risk (10/17/2023)  Financial Resource Strain: Low Risk  (06/06/2023)   Received from [facility]  Social Connections: Socially Isolated (10/17/2023)  Stress: No Stress Concern Present (04/01/2022)   [facility]  Tobacco Use: Low Risk  (10/17/2023)   SDOH Interventions:     Readmission Risk Interventions     No data to display

## 2023-10-18 NOTE — Plan of Care (Signed)
   Problem: Education: Goal: Knowledge of General Education information will improve Description: Including pain rating scale, medication(s)/side effects and non-pharmacologic comfort measures Outcome: Progressing   Problem: Education: Goal: Ability to verbalize activity precautions or restrictions will improve Outcome: Progressing   Problem: Activity: Goal: Ability to avoid complications of mobility impairment will improve Outcome: Progressing   Problem: Bowel/Gastric: Goal: Gastrointestinal status for postoperative course will improve Outcome: Progressing   Problem: Clinical Measurements: Goal: Ability to maintain clinical measurements within normal limits will improve Outcome: Progressing   Problem: Skin Integrity: Goal: Will show signs of wound healing Outcome: Progressing

## 2023-10-18 NOTE — Plan of Care (Signed)
 Documented

## 2023-10-18 NOTE — Progress Notes (Signed)
 Patient is not able to walk the distance required to go the bathroom, or he/she is unable to safely negotiate stairs required to access the bathroom.  A 3in1 BSC will alleviate this problem

## 2023-10-18 NOTE — Progress Notes (Signed)
   Neurosurgery Progress Note  History: Meagan Carr is s/p C3-6 ACDF for myelopathy   POD1: Pt doing well this morning with expected posterior neck discomfort and some trouble swallowing although she is tolerating eggs and grits for breakfast. Medications seem to be controlling pain. Denying headache  Physical Exam: Vitals:   10/18/23 0500 10/18/23 0758  BP: 135/84 (!) 151/85  Pulse: 77 72  Resp: 16 15  Temp: (!) 97.5 F (36.4 C) 98 F (36.7 C)  SpO2: 93% 94%    AA Ox3 CNI  Strength: Side Biceps Triceps Deltoid Interossei Grip Wrist Ext. Wrist Flex.  R 5 5 5 5 4  4+ 5  L 5 5 5 5 4  4+ 5    Side Iliopsoas Quads Hamstring PF DF EHL  R 5 5 5 5 5 5   L 5 5 5 5 5 5    Incision: c/d/I with dermabond in place  Data:  Other tests/results: NA  Assessment/Plan:  Meagan Carr is a 68 y.o presenting with cervical myelopathy s/p C3-6 ACDF.  - mobilize - pain control - DVT prophylaxis - PTOT; dispo plan pending recommendations  Anastacio Karvonen PA-C Department of Neurosurgery

## 2023-10-18 NOTE — Evaluation (Signed)
 Physical Therapy Evaluation Patient Details Name: Meagan Carr MRN: [id number] DOB: Jul 10, 1955 Today's Date: 10/18/2023  History of Present Illness  Meagan Carr is s/p C3-6 ACDF for myelopathy  Clinical Impression  Pt pleasant and eager to work with PT, she continues to have pain and hesitancy along with (likely med related) lethargy but ultimately did well POD1.  Pt was able to do bed mobility, transfers, prolonged bout of ambulation and negotiate up/down 4 steps all w/o physical assist but she did need plenty of cuing and reinforcement.  Pt did not have any overt LOBs but did show general unsteadiness at times (especially when not UE supported).  She does not normally need an AD, but clearly benefited from having it today.  Pt fatigued with the ~100 ft of ambulation but showed ability to safely manage in the home.  Pt will benefit from continued PT per protocols.        If plan is discharge home, recommend the following: Help with stairs or ramp for entrance;A little help with walking and/or transfers;A little help with bathing/dressing/bathroom   Can travel by private vehicle        Equipment Recommendations Rolling walker (2 wheels)  Recommendations for Other Services       Functional Status Assessment Patient has had a recent decline in their functional status and demonstrates the ability to make significant improvements in function in a reasonable and predictable amount of time.     Precautions / Restrictions Precautions Precautions: Cervical Required Braces or Orthoses: Cervical Brace Cervical Brace:  (when ambulating) Restrictions Weight Bearing Restrictions Per Provider Order: No      Mobility  Bed Mobility Overal bed mobility: Modified Independent Bed Mobility: Sit to Supine       Sit to supine: Supervision   General bed mobility comments: able to transition from sitting to supine w/o physical assist    Transfers Overall transfer level: Needs  assistance Equipment used: Rolling walker (2 wheels) Transfers: Sit to/from Stand Sit to Stand: Supervision           General transfer comment: Pt able to rise with relative confidence on 4 seperate sit to stand attempts, reminders for UE placement -    Ambulation/Gait Ambulation/Gait assistance: Supervision Gait Distance (Feet): 100 Feet Assistive device: Rolling walker (2 wheels)         General Gait Details: Pt with slow and hesistant gait but did not have any overt safety concerns.  She certainly benefited from having AD to stabilize but never appeared to have to rely heavily with WBing through UE - never the less endorses some shoulder fatigue with "prolonged" ambulation.  Stairs Stairs: Yes Stairs assistance: Supervision Stair Management: One rail Left, Sideways Number of Stairs: 4 General stair comments: After initial cuing she was able to safely negotiate up/down steps with sideway strategy  Wheelchair Mobility     Tilt Bed    Modified Rankin (Stroke Patients Only)       Balance Overall balance assessment: Needs assistance Sitting-balance support: No upper extremity supported, Feet supported Sitting balance-Leahy Scale: Good     Standing balance support: Bilateral upper extremity supported Standing balance-Leahy Scale: Fair                               Pertinent Vitals/Pain Pain Assessment Pain Assessment: 0-10 Pain Score: 6  Pain Location: posterior neck and traps    Home Living Family/patient expects to be discharged  to:: Private residence Living Arrangements: Alone Available Help at Discharge: Family;Available 24 hours/day Type of Home: House Home Access: Level entry     Alternate Level Stairs-Number of Steps: flight Home Layout: Two level;1/2 bath on main level;Bed/bath upstairs Home Equipment: Grab bars - tub/shower Additional Comments: plans to sleep downstairs    Prior Function Prior Level of Function :  Independent/Modified Independent             Mobility Comments: did not need AD, has been a weaker/less steady recently ADLs Comments: able to do all she needs, again more difficulty recently     Extremity/Trunk Assessment   Upper Extremity Assessment Upper Extremity Assessment: Generalized weakness (general w/o focal weakness, grossly functional) LUE Deficits / Details: noted weakness upon MMT; grossly 4/5    Lower Extremity Assessment Lower Extremity Assessment: Overall WFL for tasks assessed;Generalized weakness       Communication   Communication Communication: No apparent difficulties    Cognition Arousal: Alert Behavior During Therapy: WFL for tasks assessed/performed   PT - Cognitive impairments: No apparent impairments                         Following commands: Impaired Following commands impaired: Follows multi-step commands inconsistently     Cueing Cueing Techniques: Verbal cues, Gestural cues, Tactile cues     General Comments General comments (skin integrity, edema, etc.): Pt continues to be a little lethargic/groggy but was able to stay on task and hold conversation appropriately t/o the session    Exercises     Assessment/Plan    PT Assessment Patient needs continued PT services  PT Problem List Decreased strength;Decreased range of motion;Decreased activity tolerance;Decreased balance;Decreased mobility;Decreased safety awareness;Decreased knowledge of use of DME       PT Treatment Interventions DME instruction;Gait training;Stair training;Functional mobility training;Therapeutic activities;Therapeutic exercise;Balance training;Neuromuscular re-education;Patient/family education    PT Goals (Current goals can be found in the Care Plan section)  Acute Rehab PT Goals Patient Stated Goal: control pain PT Goal Formulation: With patient Time For Goal Achievement: 10/31/23 Potential to Achieve Goals: Good    Frequency 7X/week      Co-evaluation               AM-PAC PT "6 Clicks" Mobility  Outcome Measure Help needed turning from your back to your side while in a flat bed without using bedrails?: A Little Help needed moving from lying on your back to sitting on the side of a flat bed without using bedrails?: A Little Help needed moving to and from a bed to a chair (including a wheelchair)?: A Little Help needed standing up from a chair using your arms (e.g., wheelchair or bedside chair)?: A Little Help needed to walk in hospital room?: A Little Help needed climbing 3-5 steps with a railing? : A Little 6 Click Score: 18    End of Session Equipment Utilized During Treatment: Gait belt Activity Tolerance: Patient tolerated treatment well;Patient limited by fatigue;Patient limited by pain Patient left: in bed;with call bell/phone within reach Nurse Communication: Mobility status PT Visit Diagnosis: Muscle weakness (generalized) (M62.81);Pain;Difficulty in walking, not elsewhere classified (R26.2) Pain - part of body:  (cervicalgia)    Time: 1610-9604 PT Time Calculation (min) (ACUTE ONLY): 33 min   Charges:   PT Evaluation $PT Eval Low Complexity: 1 Low PT Treatments $Therapeutic Activity: 8-22 mins PT General Charges $$ ACUTE PT VISIT: 1 Visit         Meagan Carr  Meagan Carr, DPT 10/18/2023, 1:30 PM

## 2023-10-19 ENCOUNTER — Telehealth: Payer: Self-pay

## 2023-10-19 NOTE — Telephone Encounter (Signed)
 [place] called back and informed Meagan Carr that Meagan Carr said they do not have a referral for this patient. I contacted Jullie Oiler, the hospital case manager. Per Werner Hamlet with [facility] accepted patient yesterday and they typically contact the patient within 48 hours. Meagan Carr has also sent a message to our [facility] contact.

## 2023-10-19 NOTE — Telephone Encounter (Signed)
 I spoke with the patient's daughter, [place]. The patient was in the background during the call. She reports burning in the incision and in her throat. I explained that this is not uncommon and typically improves over time. She also complained of a frontal headache this morning. Her daughter states she has not complained about dizziness since they left the hospital. They inquired about food restrictions during recovery. I explained that she does not need to avoid any foods at this time, but that some foods may get stuck over the next several weeks, so she may need to cut foods into smaller bites or chew them for longer to minimize the chance of them getting stuck. I also explained that it is not uncommon to have pain in the back of the neck and in between the shoulder blades over the next several weeks.   They have not heard from [facility] yet, so Maryann Smalls provided them with [facility]'s #.  I informed [place] that we are closing at 4pm today and we are closed Monday for the holiday, but they can contact the answering service and request to page Dr Mont Antis for any urgent concerns if needed. Brittany's # is [phone].

## 2023-10-19 NOTE — Telephone Encounter (Signed)
 Meagan Carr from [place] called the office stating due to the holidays/staffing they will not be able to start service until 05.28.25

## 2023-10-23 ENCOUNTER — Emergency Department
Admission: EM | Admit: 2023-10-23 | Discharge: 2023-10-23 | Disposition: A | Discharge status: Home / Self Care | Attending: Emergency Medicine | Admitting: Emergency Medicine

## 2023-10-23 ENCOUNTER — Other Ambulatory Visit: Payer: Self-pay

## 2023-10-23 ENCOUNTER — Telehealth: Payer: Self-pay | Admitting: Neurosurgery

## 2023-10-23 DIAGNOSIS — K5900 Constipation, unspecified: Secondary | ICD-10-CM | POA: Diagnosis present

## 2023-10-23 DIAGNOSIS — K5903 Drug induced constipation: Secondary | ICD-10-CM | POA: Insufficient documentation

## 2023-10-23 DIAGNOSIS — I10 Essential (primary) hypertension: Secondary | ICD-10-CM | POA: Diagnosis not present

## 2023-10-23 DIAGNOSIS — T402X5A Adverse effect of other opioids, initial encounter: Secondary | ICD-10-CM

## 2023-10-23 DIAGNOSIS — T400X5A Adverse effect of opium, initial encounter: Secondary | ICD-10-CM | POA: Insufficient documentation

## 2023-10-23 MED ORDER — MAGNESIUM CITRATE PO SOLN: 1.0000 | Freq: Once | ORAL | 0 refills | Status: AC

## 2023-10-23 NOTE — Discharge Instructions (Addendum)
 Please drink the entire bottle of magensium citrate. Try to control your pain with tylenol  if possible. Only take the narcotic pain medication if you have break through pain. You can increase the dose of the miralax according to package instructions.  Return to the ED with any worsening symptoms.

## 2023-10-23 NOTE — Telephone Encounter (Signed)
 Patient's daughter called to report patient's increased pain in the abdomen and inability to have a bowel movement since discharge on Thursday.   She has been taking senna as prescribed. They have tried Miralax chews times two. Attempting to drink more water but unable to tolerate increased amounts of water. They tried some magnesium citrate yesterday without results.   Patient is able to pass gas, no reported rigidity of the abdomen but has 10/10 pain in lower abdomen.   Daughter has been decreasing her mom's oxycodone to at night time only due to constipation concerns.   We advised the patient seek evaluation in the ER due to her level of pain and inability to have a BM after multiple OTC modalities  for constipation.   I discussed with Lucetta Russel PA-C, she agreed that given her pain level she needs to be seen to rule out any obstruction.   Per patient and family they will go to the ER for evaluation as recommended.

## 2023-10-23 NOTE — ED Notes (Signed)
 Pt ambulates on her on and states she was only in a wheelchair in triage due to her constipation pain. Pt has ambulated back and forth to the restroom on her own without incident.

## 2023-10-23 NOTE — ED Notes (Incomplete)
Pt assisted walking to the bathroom.

## 2023-10-23 NOTE — ED Triage Notes (Signed)
 Pt arrives via POV with c/o of constipation since Wednesday. Pt states that they have tried all of the OTC remedies with no relief. Pt states that they have been drinking about 1 gal of water a day. Pain is in the RLQ and in their rectum, 10/10 and states that it is hard to sit. Pt had recent surgery neck surgery (last Wednesday) and hasn't had narcotics 3/xday since Saturday.

## 2023-10-23 NOTE — ED Notes (Addendum)
 Pt is CAOx4, breathing normally, and normal in color. Pt states she is constipated and is now in 10/10 pain in her stomach and rectum. Pt states she feels like theres a "hard ball" sitting at the entrance of her rectum but it won't come out. Pt states she is able to pass gas but not able to void. Pt states it has been 1 week since last bowel movement. Takes narcotic pain med x1 day for her neck pain.

## 2023-10-23 NOTE — ED Provider Notes (Signed)
 [facility] Provider Note    Event Date/Time   First MD Initiated Contact with Patient 10/23/23 1042     (approximate)   History   Constipation   HPI  Meagan Carr is a 68 y.o. female with PMH of hypertension, cervical myelopathy with recent anterior cervical decomp/discectomy fusion presents for evaluation of constipation.  Patient states that her last bowel movement was on Wednesday the day of her surgery.  She was taking pain medication 3 times a day but on Saturday backed down to once a day only at night.  She states that the pain is across her lower abdomen and also at the rectum.  She has pain when sitting down.  She has attempted to have bowel movements at home unsuccessfully.  She has tried drinking magnesium citrate and has been taking MiraLAX.      Physical Exam   Triage Vital Signs: ED Triage Vitals  Encounter Vitals Group     BP 10/23/23 1032 132/79     Systolic BP Percentile --      Diastolic BP Percentile --      Pulse Rate 10/23/23 1032 (!) 124     Resp 10/23/23 1032 18     Temp 10/23/23 1032 98 F (36.7 C)     Temp Source 10/23/23 1032 Oral     SpO2 10/23/23 1032 96 %     Weight 10/23/23 1034 223 lb (101.2 kg)     Height 10/23/23 1034 5\' 3"  (1.6 m)     Head Circumference --      Peak Flow --      Pain Score 10/23/23 1033 10     Pain Loc --      Pain Education --      Exclude from Growth Chart --     Most recent vital signs: Vitals:   10/23/23 1032 10/23/23 1351  BP: 132/79 (!) 128/91  Pulse: (!) 124 99  Resp: 18 18  Temp: 98 F (36.7 C)   SpO2: 96% 98%   General: Awake, no distress.  CV:  Good peripheral perfusion.  RRR. Resp:  Normal effort.  CTAB. Abd:  No distention.  Soft, tender to palpation across the lower abdomen worse in the left lower quadrant. Rectal:  No lesions on exterior anus, stool in the rectal vault   ED Results / Procedures / Treatments   Labs (all labs ordered are listed, but only abnormal  results are displayed) Labs Reviewed - No data to display   PROCEDURES:  Critical Care performed: No  .Fecal disimpaction  Date/Time: 10/23/2023 1:20 PM  Performed by: Phyliss Breen, PA-C Authorized by: Phyliss Breen, PA-C  Consent: Verbal consent obtained. Risks and benefits: risks, benefits and alternatives were discussed Consent given by: patient Patient identity confirmed: verbally with patient Local anesthesia used: no  Anesthesia: Local anesthesia used: no  Sedation: Patient sedated: no  Patient tolerance: patient tolerated the procedure well with no immediate complications      MEDICATIONS ORDERED IN ED: Medications - No data to display   IMPRESSION / MDM / ASSESSMENT AND PLAN / ED COURSE  I reviewed the triage vital signs and the nursing notes.                             68 year old female presents for evaluation of constipation. VSS and patient NAD on exam.  Differential diagnosis includes, but is not limited  to, drug induced constipation, anal fissure, hemorrhoids less likely bowel obstruction, diverticulitis, IBS.   Patient's presentation is most consistent with acute, uncomplicated illness.  Patient's last bowel movement was on Wednesday and she has been taking narcotic pain medication since surgery on Wednesday. Patient has tried miralax and magnesium citrate without relief.   Patient feels like there is hard stool blocking. On DRE patient did have palpable stool which I was able to remove. She was then given a soap suds enema. Patient was able to pass some stool but still reports discomfort.   We discussed repeating the magnesium citrate now that she is not impacted as well as using miralax. I emphasized the importance of drinking lots of water and staying mildly active.  Patient voiced understanding, all questions were answered and she was stable at discharge.     FINAL CLINICAL IMPRESSION(S) / ED DIAGNOSES   Final diagnoses:   Constipation due to opioid therapy     Rx / DC Orders   ED Discharge Orders          Ordered    magnesium citrate SOLN   Once        10/23/23 1331             Note:  This document was prepared using Dragon voice recognition software and may include unintentional dictation errors.   Phyliss Breen, PA-C 10/23/23 1453    Kandee Orion, MD 10/23/23 [phone]

## 2023-10-23 NOTE — Telephone Encounter (Signed)
 C3-6 ACDF on 10/17/23  Patient has not been able to produce stool since she left the hospital. She is having extreme stomach pain.  They have tried magnesium and Murelax. [place] her daughter wants the patient seen today.  Call [place] [phone] if she does not answer then call the pt's phone [place] [phone].

## 2023-10-24 ENCOUNTER — Telehealth: Payer: Self-pay | Admitting: Neurosurgery

## 2023-10-24 NOTE — Telephone Encounter (Signed)
 Left voicemail for Sherline Distel. Left a HIPAA compliant voicemail with the recommendations we gave for the patient

## 2023-10-24 NOTE — Telephone Encounter (Signed)
 C3-6 ACDF on 10/17/23  Meagan Carr from [place] [place] Her incision looks good.  Her pain is 8 out of 10. It could be because she is not taking oxycodone any more due to the constipation issues she has been having. So she is only taking tylenol  for pain control.  She did go to the ER yesterday regarding the constipation issues she was having. She has been able to have a bowel movement so in that area she is doing better.  She has a [place] collar, there is nothing in her orders. When can she take the collar off?

## 2023-10-26 ENCOUNTER — Telehealth: Payer: Self-pay | Admitting: Neurosurgery

## 2023-10-26 NOTE — Telephone Encounter (Signed)
 I spoke with Meagan Carr and informed her that it is OK to proceed with OT per our protocol.

## 2023-10-26 NOTE — Telephone Encounter (Signed)
 C3-6 ACDF on 10/17/23  Antony Baumgartner from [facility] left a voice message She needs verbal order to start the pt's OT next week. Phone [phone]

## 2023-10-26 NOTE — Progress Notes (Signed)
   REFERRING PHYSICIAN:  Rosezena Contes, Pa-c [address] [place],  [place] [address]  DOS: 10/17/23 ACDF C3-C6 for cervical myelopathy  HISTORY OF PRESENT ILLNESS: Meagan Carr is 2 weeks status post above surgery. Given oxycodone  and robaxin  on discharge from the hospital.   Had CSF leak that was repaired intraoperatively with no postop issues.   Seen in ED on 10/23/23 for treatment of bowel impaction/constipation.   Her preop left arm pain is better. She is having some new right shoulder and arm pain to her elbow. She has some electric sensations in her right hand as well. She has some expected posterior neck pain. Minimal swallowing issues that are improving.    PHYSICAL EXAMINATION:  NEUROLOGICAL:  General: In no acute distress.   Awake, alert, oriented to person, place, and time.  Pupils equal round and reactive to light.  Facial tone is symmetric.    Strength: Side Biceps Triceps Deltoid Interossei Grip Wrist Ext. Wrist Flex.  R 5 5 5 5 4  4+ 5  L 5 5 5 5 4  4+ 5   Incision c/d/i  Imaging:  Nothing new to review.   Assessment / Plan: Meagan Carr is doing reasonable s/p above surgery. Treatment options reviewed with patient and following plan made:   - We discussed activity escalation and I have advised the patient to lift up to 10 pounds until 6 weeks after surgery (until follow up with Dr. Mont Antis).   - Will order 2-4 more weeks of HHPT with [facility].  - Reviewed wound care.  - Continue current medications including prn oxycodone  and robaxin .  - Refill given on oxycodone . PMP reviewed and is appropriate.  - Follow up as scheduled in 4 weeks and prn. Will need cervical xrays prior to that visit.   Of note, she is scheduled for L4-L5 lumbar decompression on 12/12/23 with Dr. Mont Antis.   Advised to contact the office if any questions or concerns arise.   Lucetta Russel PA-C Dept of Neurosurgery

## 2023-11-01 ENCOUNTER — Telehealth: Payer: Self-pay | Admitting: Orthopedic Surgery

## 2023-11-01 ENCOUNTER — Encounter: Payer: Medicare HMO | Admitting: Orthopedic Surgery

## 2023-11-01 ENCOUNTER — Encounter: Payer: Self-pay | Admitting: Orthopedic Surgery

## 2023-11-01 ENCOUNTER — Ambulatory Visit: Admitting: Orthopedic Surgery

## 2023-11-01 VITALS — BP 128/80 | Temp 97.8°F | Ht 63.0 in | Wt 223.0 lb

## 2023-11-01 DIAGNOSIS — Z981 Arthrodesis status: Secondary | ICD-10-CM

## 2023-11-01 DIAGNOSIS — G959 Disease of spinal cord, unspecified: Secondary | ICD-10-CM

## 2023-11-01 MED ORDER — OXYCODONE HCL 5 MG PO TABS: 5.0000 mg | ORAL_TABLET | ORAL | 0 refills | Status: DC | PRN

## 2023-11-01 NOTE — Telephone Encounter (Signed)
 She is doing HHPT with [facility]. Please call them and give orders to continue HHPT x 2-4 weeks.   Thanks.

## 2023-11-01 NOTE — Telephone Encounter (Addendum)
 I spoke to [place] at [facility] and she has put the order in.

## 2023-11-14 NOTE — H&P (Signed)
 Referring Physician:  No referring provider defined for this encounter.  Primary Physician:  Rosezena Contes, PA-C  History of Present Illness: 10/17/2023 Meagan Carr presents today for surgery.   07/26/2023 Meagan Carr is here today with a chief complaint of imbalance, tingling in her hands, loss of dexterity, and loss of upper extremity strength.  She has been having worsening symptoms over the past year.  She is additionally having some pain into her arms consistent with a pinched nerve.  She has tried physical therapy and injections without improvement.  Her symptoms have been worsening.  In addition to above, she is also having left leg pain and back pain.  She has difficulty with walking due to this.  She gets significant buttock and leg pain whenever she walks more than a short distance.  She then sits down which helps.    Bowel/Bladder Dysfunction: none  Conservative measures:  Physical therapy:  Has participated in PT at [facility] Multimodal medical therapy including regular antiinflammatories:  Gabapentin , Diclofenac , Methocarbamol , Prednisone  Injections:  06/29/2023: Left L5-S1 transforaminal ESI (dexamethasone  8 mg) 12/20/2022: Left C7-T1 interlaminar ESI at [facility] (60% relief, bad headache)   Past Surgery: none  Steele K Juniel has clear symptoms of cervical myelopathy.  The symptoms are causing a significant impact on the patient's life.   I have utilized the care everywhere function in epic to review the outside records available from external health systems.  Review of Systems:  A 10 point review of systems is negative, except for the pertinent positives and negatives detailed in the HPI.  Past Medical History: Past Medical History:  Diagnosis Date   Fatty liver    GERD (gastroesophageal reflux disease)    Hyperlipidemia    Hypertension    Pneumonia    Pre-diabetes    Prediabetes    S/P colonoscopy 05/29/2006   Normal   S/P colonoscopy with polypectomy  05/30/1999   [place], polyps, unsure what type   Steatosis of liver     Past Surgical History: Past Surgical History:  Procedure Laterality Date   ABDOMINAL HYSTERECTOMY     ANTERIOR CERVICAL DECOMP/DISCECTOMY FUSION N/A 10/17/2023   Procedure: C3-6 ANTERIOR CERVICAL DISCECTOMY AND FUSION;  Surgeon: Jodeen Munch, MD;  Location: [facility] ORS;  Service: Neurosurgery;  Laterality: N/A;   APPENDECTOMY     c-section     X2   CATARACT EXTRACTION Left 03/20/2022   CATARACT EXTRACTION Right 03/2023   CHOLECYSTECTOMY     TEMPORAL ARTERY BIOPSY / LIGATION      Allergies: Allergies as of 07/26/2023 - Review Complete 07/26/2023  Allergen Reaction Noted   Prednisone  Other (See Comments) 01/11/2023   Latex Itching and Rash 12/12/2018    Medications: No current facility-administered medications for this encounter.  Current Outpatient Medications:    ASPIRIN  LOW DOSE 81 MG tablet, Take 81 mg by mouth at bedtime., Disp: , Rfl:    atorvastatin  (LIPITOR) 20 MG tablet, Take 20 mg by mouth at bedtime., Disp: , Rfl:    gabapentin  (NEURONTIN ) 300 MG capsule, Take 1 capsule (300 mg total) by mouth 3 (three) times daily. (Patient taking differently: Take 900 mg by mouth at bedtime.), Disp: 30 capsule, Rfl: 0   losartan  (COZAAR ) 50 MG tablet, Take 50 mg by mouth at bedtime., Disp: , Rfl:    metFORMIN  (GLUCOPHAGE -XR) 500 MG 24 hr tablet, Take 500 mg by mouth at bedtime., Disp: , Rfl:    metoprolol  succinate (TOPROL -XL) 25 MG 24 hr tablet, Take 25 mg  by mouth at bedtime., Disp: , Rfl:    Multiple Vitamin (MULTIVITAMIN WITH MINERALS) TABS tablet, Take 1 tablet by mouth in the morning., Disp: , Rfl:    pantoprazole  (PROTONIX ) 40 MG tablet, Take 40 mg by mouth at bedtime., Disp: , Rfl:    methocarbamol  (ROBAXIN ) 500 MG tablet, Take 1 tablet (500 mg total) by mouth every 6 (six) hours as needed for muscle spasms., Disp: 120 tablet, Rfl: 0   oxyCODONE  (OXY IR/ROXICODONE ) 5 MG immediate release tablet, Take  1 tablet (5 mg total) by mouth every 4 (four) hours as needed for severe pain (pain score 7-10)., Disp: 30 tablet, Rfl: 0   senna (SENOKOT) 8.6 MG TABS tablet, Take 1 tablet (8.6 mg total) by mouth 2 (two) times daily as needed for mild constipation., Disp: 60 tablet, Rfl: 0  Social History: Social History   Tobacco Use   Smoking status: Never   Smokeless tobacco: Never  Vaping Use   Vaping status: Never Used  Substance Use Topics   Alcohol use: No    Comment: spec occ   Drug use: No    Family Medical History: Family History  Problem Relation Age of Onset   Breast cancer Mother        metastasized to bones, deceased age 19   Brain cancer Father        deceased age 35   Breast cancer Sister    Breast cancer Maternal Aunt    Breast cancer Maternal Grandmother    Colon cancer Neg Hx     Physical Examination: Vitals:   10/18/23 1250 10/18/23 1608  BP: (!) 159/86 (!) 152/83  Pulse: 64 70  Resp: 16 15  Temp:  98.1 F (36.7 C)  SpO2: 95% 95%   Heart sounds normal no MRG. Chest Clear to Auscultation Bilaterally.  General: Patient is in no apparent distress. Attention to examination is appropriate.  Neck:   Supple.  Full range of motion.  Respiratory: Patient is breathing without any difficulty.   NEUROLOGICAL:     Awake, alert, oriented to person, place, and time.  Speech is clear and fluent.   Cranial Nerves: Pupils equal round and reactive to light.  Facial tone is symmetric.  Facial sensation is symmetric. Shoulder shrug is symmetric. Tongue protrusion is midline.  There is no pronator drift.  Strength: Side Biceps Triceps Deltoid Interossei Grip Wrist Ext. Wrist Flex.  R 5 5 5 5 4  4+ 5  L 5 5 5 5 4  4+ 5   Side Iliopsoas Quads Hamstring PF DF EHL  R 5 5 5 5 5 5   L 5 5 5 5 5 5    Reflexes are 2+ and symmetric at the biceps, triceps, brachioradialis, patella and achilles.   Hoffman's is present.   Bilateral upper and lower extremity sensation is intact to  light touch.    No evidence of dysmetria noted.  Gait is wide-based and very abnormal.  She is unable to perform tandem gait..     Medical Decision Making  Imaging: MRI C spine 11/21/2022 C3-C4: Small central disc protrusion. Right greater than facet and uncovertebral arthropathy. Moderate canal stenosis with moderate right foraminal stenosis. Findings have progressed from prior.   C4-C5: Broad-based disc bulge with right greater than left facet and uncovertebral arthropathy. Moderate canal stenosis with moderate right and mild left foraminal stenosis. No significant interval progression.   C5-C6: Disc osteophyte complex with bilateral facet and uncovertebral arthropathy. Moderate canal stenosis with severe right and moderate  left foraminal stenosis. No significant interval progression.   C6-C7: Disc osteophyte complex with left greater than right uncovertebral spurring and mild facet hypertrophy. Mild canal stenosis with moderate bilateral foraminal stenosis. No significant interval progression.   C7-T1: Bilateral facet hypertrophy and mild uncovertebral spurring. Mild left foraminal stenosis. No canal stenosis. No significant interval progression.   IMPRESSION: 1. Multilevel cervical spondylosis with moderate canal stenosis at C3-4, C4-5, and C5-6. 2. Multilevel foraminal stenosis, severe on the right at C5-6.     Electronically Signed   By: Leverne Reading D.O.   On: 11/23/2022 16:17 MRI L spine 06/13/2023 L1-2: Normal disc. Mild-to-moderate facet and ligamentum flavum hypertrophy without stenosis.   L2-3: Minimal disc bulging and moderate facet and ligamentum flavum hypertrophy without stenosis.   L3-4: Mild disc bulging eccentric to the left and moderate facet and ligamentum flavum hypertrophy result in mild left neural foraminal stenosis without spinal stenosis.   L4-5: Disc bulging, congenitally short pedicles, prominent dorsal epidural fat, and moderate  right and severe left facet and ligamentum flavum hypertrophy result in severe spinal stenosis, left greater than right lateral recess stenosis, and moderate bilateral neural foraminal stenosis.   L5-S1: Minimal disc bulging and mild facet hypertrophy without stenosis. Multilevel   IMPRESSION: 1. Lumbar spondylosis and facet arthrosis, most notable at L4-5 where there is severe spinal stenosis and moderate neural foraminal stenosis. 2. Mild spinal stenosis and moderate to severe right neural foraminal stenosis at T11-12.     Electronically Signed   By: Aundra Lee M.D.   On: 06/15/2023 15:23 I have personally reviewed the images and agree with the above interpretation.  Assessment and Plan: Meagan Carr is a pleasant 68 y.o. 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  She also has cervical radiculopathy.  We will proceed with C3-6 anterior cervical discectomy and fusion.      Thank you for involving me in the care of this patient.      Madelene Kaatz K. Mont Antis MD, [facility] Neurosurgery

## 2023-11-20 ENCOUNTER — Telehealth: Payer: Self-pay | Admitting: Neurosurgery

## 2023-11-20 NOTE — Telephone Encounter (Signed)
 C3-6 ACDF on 10/17/23 She is still reporting pain and swelling on left side of her neck. She feels things still get stuck in her throat some times. Please call the patient or Damien. Per Damien call the pt's daughter or son since patient has missed placed her phone.

## 2023-11-20 NOTE — Telephone Encounter (Signed)
 DOS: 10/17/23 ACDF C3-C6 for cervical myelopathy   I spoke with Meagan Carr (via her daughter's number).  She reports she is still having swallowing issues where she feels like food isn't going down as well as it should. She has to eat slower and take smaller bites. Her swallowing has not gotten any worse over the past couple of weeks. She has a difficult time eating chicken.  We discussed that it is not uncommon to experience difficulty swallowing for about 3 months after surgery. I encouraged her to continue taking small bites and eating slow.  Her incision is on the left. She denies drainage from her incision. I informed her that she could send a picture through mychart if she had any concerns.  She also reports stiffness in her neck and pain that starts under her chin on the left and wraps around to the back side of her neck. It was intermittent, particularly when she was wearing her brace, but now it is throbbing more frequently. This has worsening since Friday and has been throbbing all day today.   She has oxycodone , but only takes it when her pain feels like it is a 10. She takes tylenol  (three times per day), gabapentin  (300mg  three times per day), and methocarbamol  (twice per day).    I offered to speak to a provider about changing her muscle relaxer, but she declined. She would like to avoid steroids as prednisone  causes migraines.   She has only been taking the methocarbamol  twice per day (instead of every 6 hours as prescribed). She is going to try taking it as prescribed and will call us  back if that does not help with her neck pain and stiffness.

## 2023-11-22 ENCOUNTER — Encounter: Payer: Self-pay | Admitting: Physician Assistant

## 2023-11-22 ENCOUNTER — Other Ambulatory Visit: Payer: Self-pay | Admitting: Physician Assistant

## 2023-11-22 MED ORDER — CELECOXIB 100 MG PO CAPS: 100.0000 mg | ORAL_CAPSULE | Freq: Two times a day (BID) | ORAL | 2 refills | Status: DC

## 2023-11-22 MED ORDER — TIZANIDINE HCL 4 MG PO TABS: 4.0000 mg | ORAL_TABLET | Freq: Three times a day (TID) | ORAL | 1 refills | Status: AC | PRN

## 2023-11-22 MED ORDER — CELECOXIB 100 MG PO CAPS: 100.0000 mg | ORAL_CAPSULE | Freq: Two times a day (BID) | ORAL | 2 refills | Status: AC

## 2023-11-22 MED ORDER — ACETAMINOPHEN 500 MG PO TABS: 1000.0000 mg | ORAL_TABLET | Freq: Three times a day (TID) | ORAL | 2 refills | Status: AC

## 2023-11-22 NOTE — Progress Notes (Signed)
refill 

## 2023-11-22 NOTE — Telephone Encounter (Signed)
 Meagan Carr stopped by the office today. She reports that the neck pain has worsened since we last spoke. Increasing the methocarbamol  to every 6 hours has not helped her symptoms. She has continued the other medications as I previously documented on 11/20/23. She feels like the left side of her neck to her occipital area is swollen and throbbing. I do not appreciate any significant swelling or incision concerns on my exam. I discussed her concerns with Lyle Decamp, PA-C. We will switch her from methocarbamol  to tizanidine and start her on celebrex. She will continue with tylenol  TID. We have scheduled her an appointment for 11/27/23 with xrays, but she can cancel this if she is feeling better. I have thoroughly discussed the above with the patient and provided written instructions.

## 2023-11-23 ENCOUNTER — Other Ambulatory Visit: Payer: Self-pay

## 2023-11-23 DIAGNOSIS — G959 Disease of spinal cord, unspecified: Secondary | ICD-10-CM

## 2023-11-26 ENCOUNTER — Ambulatory Visit
Admission: RE | Admit: 2023-11-26 | Discharge: 2023-11-26 | Disposition: A | Discharge status: Home / Self Care | Source: Ambulatory Visit | Attending: Orthopedic Surgery | Admitting: Orthopedic Surgery

## 2023-11-26 ENCOUNTER — Ambulatory Visit
Admission: RE | Admit: 2023-11-26 | Discharge: 2023-11-26 | Disposition: A | Discharge status: Home / Self Care | Attending: Orthopedic Surgery | Admitting: Orthopedic Surgery

## 2023-11-26 DIAGNOSIS — G959 Disease of spinal cord, unspecified: Secondary | ICD-10-CM | POA: Insufficient documentation

## 2023-11-26 NOTE — Progress Notes (Unsigned)
   REFERRING PHYSICIAN:  Dot Boot, Pa-c [address] [place],  [place] [address]  DOS: 10/17/23 ACDF C3-C6 for cervical myelopathy  HISTORY OF PRESENT ILLNESS:  She was doing better at her last visit. Left arm pain had improved but she was having some new right shoulder and arm pain to her elbow.   She came to office on 11/22/23 with increased neck pain. She was to stop robaxin . She was started on celebrex  and zanaflex .   She is here for follow up.   She has left sided posterior neck pain into her left shoulder along with some anterior neck pain above her incision. This is better than it was last week, but is still constant. No radicular arm pain. She still has some numbness and tingling in both hands that is intermittent. She continues to have some difficulties swallowing meat/bread. Does okay with pills.   She is taking tylenol , celebrex , neurontin , and zanaflex .   Of note, she is scheduled for L4-L5 lumbar decompression on 12/12/23 with Dr. Clois.   PHYSICAL EXAMINATION:  NEUROLOGICAL:  General: In no acute distress.   Awake, alert, oriented to person, place, and time.  Pupils equal round and reactive to light.  Facial tone is symmetric.    Strength: Side Biceps Triceps Deltoid Interossei Grip Wrist Ext. Wrist Flex.  R 5 5 5 5 4  4+ 5  L 5 5 5 5 4  4+ 5   Incision c/d/i  Imaging:  Cervical xrays dated 11/26/23:  No complications noted.   Report not yet available for above xrays.   Assessment / Plan: Meagan Carr is doing reasonable s/p above surgery. Treatment options reviewed with patient and following plan made:   - We discussed activity escalation and I have advised the patient to lift up to 10 pounds.  - Reviewed wound care.  - Continue current medications including zanaflex , celebrex , neurotin, and prn tylenol .  - Will review xrays with Dr. Claudene to see if she can start to wean out of collar. Will message her.  - She will keep her follow up with Dr.  Clois as scheduled on 12/10/13.   Of note, she is scheduled for L4-L5 lumbar decompression on 12/12/23 with Dr. Clois.   Advised to contact the office if any questions or concerns arise.   Glade Boys PA-C Dept of Neurosurgery

## 2023-11-27 ENCOUNTER — Encounter: Admitting: Neurosurgery

## 2023-11-27 ENCOUNTER — Ambulatory Visit: Admitting: Orthopedic Surgery

## 2023-11-27 ENCOUNTER — Encounter: Payer: Self-pay | Admitting: Orthopedic Surgery

## 2023-11-27 VITALS — BP 128/84 | Ht 63.0 in | Wt 223.0 lb

## 2023-11-27 DIAGNOSIS — G959 Disease of spinal cord, unspecified: Secondary | ICD-10-CM

## 2023-11-27 DIAGNOSIS — Z981 Arthrodesis status: Secondary | ICD-10-CM

## 2023-11-29 ENCOUNTER — Encounter: Payer: Medicare HMO | Admitting: Neurosurgery

## 2023-12-03 ENCOUNTER — Other Ambulatory Visit: Payer: Self-pay

## 2023-12-03 ENCOUNTER — Encounter
Admission: RE | Admit: 2023-12-03 | Discharge: 2023-12-03 | Disposition: A | Discharge status: Home / Self Care | Source: Ambulatory Visit | Attending: Neurosurgery | Admitting: Neurosurgery

## 2023-12-03 NOTE — Patient Instructions (Addendum)
 Your procedure is scheduled on: Wednesday 12/12/23 Report to the Registration Desk on the 1st floor of the [facility]. To find out your arrival time, please call [phone] between 1PM - 3PM on: Tuesday 12/11/23 If your arrival time is 6:00 am, do not arrive before that time as the [facility] entrance doors do not open until 6:00 am.  REMEMBER: Instructions that are not followed completely may result in serious medical risk, up to and including death; or upon the discretion of your surgeon and anesthesiologist your surgery may need to be rescheduled.  Do not eat food after midnight the night before surgery.  No gum chewing or hard candies.  You may however, drink CLEAR liquids up to 2 hours before you are scheduled to arrive for your surgery. Do not drink anything within 2 hours of your scheduled arrival time.  Clear liquids include: - water  - apple juice without pulp - gatorade (not RED colors) - black coffee or tea (Do NOT add milk or creamers to the coffee or tea) Do NOT drink anything that is not on this list.  **Type 1 and Type 2 diabetics should only drink water.**  One week prior to surgery: Stop Anti-inflammatories (NSAIDS) such as Advil , Aleve , Ibuprofen , Motrin , Naproxen , Naprosyn  and Aspirin  based products such as Excedrin, Goody's Powder, BC Powder. You may however, continue to take Tylenol  if needed for pain up until the day of surgery.  Stop ANY OVER THE COUNTER supplements and vitamins until after surgery.    **Follow guidelines for insulin and diabetes medications.** Metformin , hold for 2 days. Last dose Sunday 12/09/23  Continue taking all of your other prescription medications up until the day of surgery.  ON THE DAY OF SURGERY ONLY TAKE THESE MEDICATIONS WITH SIPS OF WATER:  pantoprazole  (PROTONIX ) 40 MG tablet   Use inhalers on the day of surgery and bring to the hospital. (If Needed)  No Alcohol for 24 hours before or after surgery.  No Smoking  including e-cigarettes for 24 hours before surgery.  No chewable tobacco products for at least 6 hours before surgery.  No nicotine patches on the day of surgery.  Do not use any recreational drugs for at least a week (preferably 2 weeks) before your surgery.  Please be advised that the combination of cocaine and anesthesia may have negative outcomes, up to and including death. If you test positive for cocaine, your surgery will be cancelled.  On the morning of surgery brush your teeth with toothpaste and water, you may rinse your mouth with mouthwash if you wish. Do not swallow any toothpaste or mouthwash.  Use CHG Soap or wipes as directed on instruction sheet.  Do not wear jewelry, make-up, hairpins, clips or nail polish.  For welded (permanent) jewelry: bracelets, anklets, waist bands, etc.  Please have this removed prior to surgery.  If it is not removed, there is a chance that hospital personnel will need to cut it off on the day of surgery.  Do not wear lotions, powders, or perfumes on the day of surgery   Do not shave body hair from the neck down 48 hours before surgery.  Contact lenses, hearing aids and dentures may not be worn into surgery.  Do not bring valuables to the hospital. [facility] is not responsible for any missing/lost belongings or valuables.   Notify your doctor if there is any change in your medical condition (cold, fever, infection).  Wear comfortable clothing (specific to your surgery type) to  the hospital.  After surgery, you can help prevent lung complications by doing breathing exercises.  Take deep breaths and cough every 1-2 hours. Your doctor may order a device called an Incentive Spirometer to help you take deep breaths.  If you are being admitted to the hospital overnight, leave your suitcase in the car. After surgery it may be brought to your room.  In case of increased patient census, it may be necessary for you, the patient, to continue your  postoperative care in the Same Day Surgery department.  If you are being discharged the day of surgery, you will not be allowed to drive home. You will need a responsible individual to drive you home and stay with you for 24 hours after surgery.   If you are taking public transportation, you will need to have a responsible individual with you.  Please call the Pre-admissions Testing Dept. at [phone] if you have any questions about these instructions.  Surgery Visitation Policy:  Patients having surgery or a procedure may have two visitors.  Children under the age of 39 must have an adult with them who is not the patient.   Merchandiser, retail to address health-related social needs:  [email or web address]    Pre-operative 5 CHG Bath Instructions   You can play a key role in reducing the risk of infection after surgery. Your skin needs to be as free of germs as possible. You can reduce the number of germs on your skin by washing with CHG (chlorhexidine  gluconate) soap before surgery. CHG is an antiseptic soap that kills germs and continues to kill germs even after washing.   DO NOT use if you have an allergy to chlorhexidine /CHG or antibacterial soaps. If your skin becomes reddened or irritated, stop using the CHG and notify one of our RNs at [phone].   Please shower with the CHG soap starting 4 days before surgery using the following schedule:   Saturday 12/08/23 = Wednesday 12/12/23    Please keep in mind the following:  DO NOT shave, including legs and underarms, starting the day of your first shower.   You may shave your face at any point before/day of surgery.  Place clean sheets on your bed the day you start using CHG soap. Use a clean washcloth (not used since being washed) for each shower. DO NOT sleep with pets once you start using the CHG.   CHG Shower Instructions:  If you choose to wash your hair and private area, wash first with your  normal shampoo/soap.  After you use shampoo/soap, rinse your hair and body thoroughly to remove shampoo/soap residue.  Turn the water OFF and apply about 3 tablespoons (45 ml) of CHG soap to a CLEAN washcloth.  Apply CHG soap ONLY FROM YOUR NECK DOWN TO YOUR TOES (washing for 3-5 minutes)  DO NOT use CHG soap on face, private areas, open wounds, or sores.  Pay special attention to the area where your surgery is being performed.  If you are having back surgery, having someone wash your back for you may be helpful. Wait 2 minutes after CHG soap is applied, then you may rinse off the CHG soap.  Pat dry with a clean towel  Put on clean clothes/pajamas   If you choose to wear lotion, please use ONLY the CHG-compatible lotions on the back of this paper.     Additional instructions for the day of surgery: DO NOT APPLY any lotions, deodorants, cologne, or perfumes.   Put  on clean/comfortable clothes.  Brush your teeth.  Ask your nurse before applying any prescription medications to the skin.      CHG Compatible Lotions   Aveeno Moisturizing lotion  Cetaphil Moisturizing Cream  Cetaphil Moisturizing Lotion  Clairol Herbal Essence Moisturizing Lotion, Dry Skin  Clairol Herbal Essence Moisturizing Lotion, Extra Dry Skin  Clairol Herbal Essence Moisturizing Lotion, Normal Skin  Curel Age Defying Therapeutic Moisturizing Lotion with Alpha Hydroxy  Curel Extreme Care Body Lotion  Curel Soothing Hands Moisturizing Hand Lotion  Curel Therapeutic Moisturizing Cream, Fragrance-Free  Curel Therapeutic Moisturizing Lotion, Fragrance-Free  Curel Therapeutic Moisturizing Lotion, Original Formula  Eucerin Daily Replenishing Lotion  Eucerin Dry Skin Therapy Plus Alpha Hydroxy Crme  Eucerin Dry Skin Therapy Plus Alpha Hydroxy Lotion  Eucerin Original Crme  Eucerin Original Lotion  Eucerin Plus Crme Eucerin Plus Lotion  Eucerin TriLipid Replenishing Lotion  Keri Anti-Bacterial Hand Lotion  Keri  Deep Conditioning Original Lotion Dry Skin Formula Softly Scented  Keri Deep Conditioning Original Lotion, Fragrance Free Sensitive Skin Formula  Keri Lotion Fast Absorbing Fragrance Free Sensitive Skin Formula  Keri Lotion Fast Absorbing Softly Scented Dry Skin Formula  Keri Original Lotion  Keri Skin Renewal Lotion Keri Silky Smooth Lotion  Keri Silky Smooth Sensitive Skin Lotion  Nivea Body Creamy Conditioning Oil  Nivea Body Extra Enriched Teacher, adult education Moisturizing Lotion Nivea Crme  Nivea Skin Firming Lotion  NutraDerm 30 Skin Lotion  NutraDerm Skin Lotion  NutraDerm Therapeutic Skin Cream  NutraDerm Therapeutic Skin Lotion  ProShield Protective Hand Cream  Provon moisturizing lotion

## 2023-12-04 ENCOUNTER — Encounter: Payer: Self-pay | Admitting: Urgent Care

## 2023-12-04 ENCOUNTER — Encounter
Admission: RE | Admit: 2023-12-04 | Discharge: 2023-12-04 | Disposition: A | Discharge status: Home / Self Care | Source: Ambulatory Visit | Attending: Neurosurgery | Admitting: Neurosurgery

## 2023-12-04 ENCOUNTER — Other Ambulatory Visit: Payer: Self-pay

## 2023-12-04 DIAGNOSIS — Z01818 Encounter for other preprocedural examination: Secondary | ICD-10-CM

## 2023-12-04 DIAGNOSIS — Z01812 Encounter for preprocedural laboratory examination: Secondary | ICD-10-CM | POA: Insufficient documentation

## 2023-12-04 DIAGNOSIS — G959 Disease of spinal cord, unspecified: Secondary | ICD-10-CM

## 2023-12-04 DIAGNOSIS — M4802 Spinal stenosis, cervical region: Secondary | ICD-10-CM

## 2023-12-04 LAB — TYPE AND SCREEN
ABO/RH(D): B POS
Antibody Screen: NEGATIVE

## 2023-12-04 LAB — SURGICAL PCR SCREEN
MRSA, PCR: NEGATIVE
Staphylococcus aureus: NEGATIVE

## 2023-12-11 ENCOUNTER — Encounter: Admitting: Neurosurgery

## 2023-12-12 ENCOUNTER — Other Ambulatory Visit: Payer: Self-pay

## 2023-12-12 ENCOUNTER — Encounter: Payer: Self-pay | Admitting: Neurosurgery

## 2023-12-12 ENCOUNTER — Ambulatory Visit
Admission: RE | Admit: 2023-12-12 | Discharge: 2023-12-12 | Disposition: A | Discharge status: Home / Self Care | Payer: Medicare HMO | Source: Ambulatory Visit | Attending: Neurosurgery | Admitting: Neurosurgery

## 2023-12-12 ENCOUNTER — Ambulatory Visit

## 2023-12-12 ENCOUNTER — Encounter
Admission: RE | Disposition: A | Discharge status: Home / Self Care | Payer: Self-pay | Source: Ambulatory Visit | Attending: Neurosurgery

## 2023-12-12 DIAGNOSIS — Z6839 Body mass index (BMI) 39.0-39.9, adult: Secondary | ICD-10-CM | POA: Diagnosis not present

## 2023-12-12 DIAGNOSIS — Z981 Arthrodesis status: Secondary | ICD-10-CM

## 2023-12-12 DIAGNOSIS — K219 Gastro-esophageal reflux disease without esophagitis: Secondary | ICD-10-CM | POA: Insufficient documentation

## 2023-12-12 DIAGNOSIS — R7303 Prediabetes: Secondary | ICD-10-CM | POA: Diagnosis not present

## 2023-12-12 DIAGNOSIS — G959 Disease of spinal cord, unspecified: Secondary | ICD-10-CM

## 2023-12-12 DIAGNOSIS — G992 Myelopathy in diseases classified elsewhere: Secondary | ICD-10-CM | POA: Insufficient documentation

## 2023-12-12 DIAGNOSIS — K760 Fatty (change of) liver, not elsewhere classified: Secondary | ICD-10-CM | POA: Diagnosis not present

## 2023-12-12 DIAGNOSIS — M48062 Spinal stenosis, lumbar region with neurogenic claudication: Secondary | ICD-10-CM | POA: Diagnosis not present

## 2023-12-12 DIAGNOSIS — I10 Essential (primary) hypertension: Secondary | ICD-10-CM | POA: Diagnosis not present

## 2023-12-12 DIAGNOSIS — E66813 Obesity, class 3: Secondary | ICD-10-CM | POA: Diagnosis not present

## 2023-12-12 DIAGNOSIS — Z01818 Encounter for other preprocedural examination: Secondary | ICD-10-CM

## 2023-12-12 HISTORY — PX: LUMBAR LAMINECTOMY/DECOMPRESSION MICRODISCECTOMY: SHX5026

## 2023-12-12 SURGERY — LUMBAR LAMINECTOMY/DECOMPRESSION MICRODISCECTOMY 1 LEVEL
Anesthesia: General | Site: Spine Lumbar

## 2023-12-12 MED ORDER — OXYCODONE HCL 5 MG PO TABS
ORAL_TABLET | ORAL | Status: AC
  Filled 2023-12-12: qty 1

## 2023-12-12 MED ORDER — LIDOCAINE HCL (CARDIAC) PF 100 MG/5ML IV SOSY
PREFILLED_SYRINGE | INTRAVENOUS | Status: DC | PRN
  Administered 2023-12-12: 80 mg via INTRAVENOUS

## 2023-12-12 MED ORDER — ACETAMINOPHEN 10 MG/ML IV SOLN
INTRAVENOUS | Status: AC
  Filled 2023-12-12: qty 100

## 2023-12-12 MED ORDER — OXYCODONE HCL 5 MG/5ML PO SOLN: 5.0000 mg | Freq: Once | ORAL | Status: AC | PRN

## 2023-12-12 MED ORDER — CHLORHEXIDINE GLUCONATE 0.12 % MT SOLN
OROMUCOSAL | Status: AC
  Filled 2023-12-12: qty 15

## 2023-12-12 MED ORDER — FENTANYL CITRATE (PF) 100 MCG/2ML IJ SOLN
INTRAMUSCULAR | Status: AC
  Filled 2023-12-12: qty 2

## 2023-12-12 MED ORDER — CEFAZOLIN SODIUM-DEXTROSE 2-4 GM/100ML-% IV SOLN
INTRAVENOUS | Status: AC
  Filled 2023-12-12: qty 100

## 2023-12-12 MED ORDER — BUPIVACAINE HCL (PF) 0.5 % IJ SOLN
INTRAMUSCULAR | Status: AC
  Filled 2023-12-12: qty 30

## 2023-12-12 MED ORDER — PHENYLEPHRINE HCL-NACL 20-0.9 MG/250ML-% IV SOLN
INTRAVENOUS | Status: AC
Start: 2023-12-12 — End: 2023-12-12
  Filled 2023-12-12: qty 250

## 2023-12-12 MED ORDER — SUCCINYLCHOLINE CHLORIDE 200 MG/10ML IV SOSY
PREFILLED_SYRINGE | INTRAVENOUS | Status: DC | PRN
  Administered 2023-12-12: 60 mg via INTRAVENOUS

## 2023-12-12 MED ORDER — FENTANYL CITRATE (PF) 100 MCG/2ML IJ SOLN
INTRAMUSCULAR | Status: DC | PRN
  Administered 2023-12-12 (×2): 50 ug via INTRAVENOUS

## 2023-12-12 MED ORDER — FENTANYL CITRATE (PF) 100 MCG/2ML IJ SOLN
25.0000 ug | INTRAMUSCULAR | Status: DC | PRN
  Administered 2023-12-12 (×2): 50 ug via INTRAVENOUS

## 2023-12-12 MED ORDER — SODIUM CHLORIDE 0.9 % IV SOLN
INTRAVENOUS | Status: DC | PRN
  Administered 2023-12-12: .1 ug/kg/min via INTRAVENOUS

## 2023-12-12 MED ORDER — ORAL CARE MOUTH RINSE
15.0000 mL | Freq: Once | OROMUCOSAL | Status: AC
Start: 2023-12-12 — End: 2023-12-12

## 2023-12-12 MED ORDER — BUPIVACAINE-EPINEPHRINE (PF) 0.5% -1:200000 IJ SOLN
INTRAMUSCULAR | Status: AC
  Filled 2023-12-12: qty 10

## 2023-12-12 MED ORDER — SODIUM CHLORIDE 0.9 % IV SOLN: INTRAVENOUS | Status: DC

## 2023-12-12 MED ORDER — OXYCODONE HCL 5 MG PO TABS
5.0000 mg | ORAL_TABLET | ORAL | 0 refills | Status: DC | PRN
  Filled 2023-12-12: qty 30, 5d supply, fill #0

## 2023-12-12 MED ORDER — PROPOFOL 10 MG/ML IV BOLUS
INTRAVENOUS | Status: AC
  Filled 2023-12-12: qty 20

## 2023-12-12 MED ORDER — PHENYLEPHRINE HCL-NACL 20-0.9 MG/250ML-% IV SOLN
INTRAVENOUS | Status: AC
  Filled 2023-12-12: qty 250

## 2023-12-12 MED ORDER — ONDANSETRON HCL 4 MG/2ML IJ SOLN
INTRAMUSCULAR | Status: DC | PRN
  Administered 2023-12-12: 4 mg via INTRAVENOUS

## 2023-12-12 MED ORDER — MIDAZOLAM HCL 2 MG/2ML IJ SOLN
INTRAMUSCULAR | Status: DC | PRN
  Administered 2023-12-12: 1 mg via INTRAVENOUS

## 2023-12-12 MED ORDER — OXYCODONE HCL 5 MG PO TABS
5.0000 mg | ORAL_TABLET | Freq: Once | ORAL | Status: AC | PRN
  Administered 2023-12-12: 5 mg via ORAL

## 2023-12-12 MED ORDER — CEFAZOLIN IN SODIUM CHLORIDE 2-0.9 GM/100ML-% IV SOLN
2.0000 g | Freq: Once | INTRAVENOUS | Status: DC
  Filled 2023-12-12: qty 100

## 2023-12-12 MED ORDER — REMIFENTANIL HCL 1 MG IV SOLR
INTRAVENOUS | Status: AC
  Filled 2023-12-12: qty 1000

## 2023-12-12 MED ORDER — PHENYLEPHRINE HCL-NACL 20-0.9 MG/250ML-% IV SOLN
INTRAVENOUS | Status: DC | PRN
Start: 2023-12-12 — End: 2023-12-12
  Administered 2023-12-12: 40 ug/min via INTRAVENOUS

## 2023-12-12 MED ORDER — PROPOFOL 1000 MG/100ML IV EMUL
INTRAVENOUS | Status: AC
  Filled 2023-12-12: qty 100

## 2023-12-12 MED ORDER — PHENYLEPHRINE 80 MCG/ML (10ML) SYRINGE FOR IV PUSH (FOR BLOOD PRESSURE SUPPORT)
PREFILLED_SYRINGE | INTRAVENOUS | Status: DC | PRN
  Administered 2023-12-12: 80 ug via INTRAVENOUS

## 2023-12-12 MED ORDER — METHYLPREDNISOLONE ACETATE 40 MG/ML IJ SUSP
INTRAMUSCULAR | Status: AC
  Filled 2023-12-12: qty 1

## 2023-12-12 MED ORDER — METHYLPREDNISOLONE ACETATE 40 MG/ML IJ SUSP
INTRAMUSCULAR | Status: DC | PRN
  Administered 2023-12-12: 40 mg

## 2023-12-12 MED ORDER — ONDANSETRON HCL 4 MG/2ML IJ SOLN: 4.0000 mg | Freq: Once | INTRAMUSCULAR | Status: DC | PRN

## 2023-12-12 MED ORDER — LACTATED RINGERS IV SOLN: INTRAVENOUS | Status: DC

## 2023-12-12 MED ORDER — CHLORHEXIDINE GLUCONATE 0.12 % MT SOLN
15.0000 mL | Freq: Once | OROMUCOSAL | Status: AC
  Administered 2023-12-12: 15 mL via OROMUCOSAL

## 2023-12-12 MED ORDER — BUPIVACAINE LIPOSOME 1.3 % IJ SUSP
INTRAMUSCULAR | Status: AC
  Filled 2023-12-12: qty 20

## 2023-12-12 MED ORDER — BUPIVACAINE-EPINEPHRINE (PF) 0.5% -1:200000 IJ SOLN
INTRAMUSCULAR | Status: DC | PRN
  Administered 2023-12-12: 8 mL

## 2023-12-12 MED ORDER — DEXAMETHASONE SODIUM PHOSPHATE 10 MG/ML IJ SOLN
INTRAMUSCULAR | Status: DC | PRN
Start: 2023-12-12 — End: 2023-12-12
  Administered 2023-12-12: 10 mg via INTRAVENOUS

## 2023-12-12 MED ORDER — SODIUM CHLORIDE (PF) 0.9 % IJ SOLN
INTRAMUSCULAR | Status: DC | PRN
  Administered 2023-12-12: 60 mL via INTRAMUSCULAR

## 2023-12-12 MED ORDER — ACETAMINOPHEN 10 MG/ML IV SOLN
INTRAVENOUS | Status: DC | PRN
  Administered 2023-12-12: 1000 mg via INTRAVENOUS

## 2023-12-12 MED ORDER — CEFAZOLIN SODIUM-DEXTROSE 2-4 GM/100ML-% IV SOLN
2.0000 g | INTRAVENOUS | Status: AC
  Administered 2023-12-12: 2 g via INTRAVENOUS

## 2023-12-12 MED ORDER — PROPOFOL 10 MG/ML IV BOLUS
INTRAVENOUS | Status: DC | PRN
  Administered 2023-12-12: 100 ug/kg/min via INTRAVENOUS
  Administered 2023-12-12: 160 mg via INTRAVENOUS

## 2023-12-12 MED ORDER — SODIUM CHLORIDE (PF) 0.9 % IJ SOLN
INTRAMUSCULAR | Status: AC
  Filled 2023-12-12: qty 20

## 2023-12-12 MED ORDER — MIDAZOLAM HCL 2 MG/2ML IJ SOLN
INTRAMUSCULAR | Status: AC
  Filled 2023-12-12: qty 2

## 2023-12-12 MED ORDER — ACETAMINOPHEN 10 MG/ML IV SOLN: 1000.0000 mg | Freq: Once | INTRAVENOUS | Status: DC | PRN

## 2023-12-12 MED ORDER — 0.9 % SODIUM CHLORIDE (POUR BTL) OPTIME
TOPICAL | Status: DC | PRN
  Administered 2023-12-12 (×2): 500 mL

## 2023-12-12 MED ORDER — SURGIFLO WITH THROMBIN (HEMOSTATIC MATRIX KIT) OPTIME
TOPICAL | Status: DC | PRN
  Administered 2023-12-12: 1 via TOPICAL

## 2023-12-12 SURGICAL SUPPLY — 30 items
BASIN KIT SINGLE STR (MISCELLANEOUS) ×2 IMPLANT
BUR NEURO DRILL SOFT 3.0X3.8M (BURR) ×2 IMPLANT
DERMABOND ADVANCED .7 DNX12 (GAUZE/BANDAGES/DRESSINGS) ×2 IMPLANT
DRAPE C ARM PK CFD 31 SPINE (DRAPES) ×2 IMPLANT
DRAPE LAPAROTOMY 100X77 ABD (DRAPES) ×2 IMPLANT
DRAPE MICROSCOPE SPINE 48X150 (DRAPES) ×2 IMPLANT
DRSG OPSITE POSTOP 3X4 (GAUZE/BANDAGES/DRESSINGS) ×2 IMPLANT
DRSG OPSITE POSTOP 4X6 (GAUZE/BANDAGES/DRESSINGS) IMPLANT
ELECTRODE EZSTD 165MM 6.5IN (MISCELLANEOUS) ×2 IMPLANT
ELECTRODE REM PT RTRN 9FT ADLT (ELECTROSURGICAL) ×2 IMPLANT
GLOVE BIOGEL PI IND STRL 6.5 (GLOVE) ×2 IMPLANT
GLOVE SURG SYN 6.5 PF PI (GLOVE) ×2 IMPLANT
GLOVE SURG SYN 8.5 PF PI (GLOVE) ×6 IMPLANT
GOWN SRG LRG LVL 4 IMPRV REINF (GOWNS) ×2 IMPLANT
GOWN SRG XL LVL 3 NONREINFORCE (GOWNS) ×2 IMPLANT
KIT SPINAL PRONEVIEW (KITS) ×2 IMPLANT
MANIFOLD NEPTUNE II (INSTRUMENTS) ×2 IMPLANT
MARKER SKIN DUAL TIP RULER LAB (MISCELLANEOUS) ×2 IMPLANT
NDL SAFETY ECLIPSE 18X1.5 (NEEDLE) ×2 IMPLANT
NS IRRIG 500ML POUR BTL (IV SOLUTION) ×2 IMPLANT
PACK LAMINECTOMY ARMC (PACKS) ×2 IMPLANT
PAD ARMBOARD POSITIONER FOAM (MISCELLANEOUS) ×2 IMPLANT
SURGIFLO W/THROMBIN 8M KIT (HEMOSTASIS) ×2 IMPLANT
SUT STRATA 3-0 15 PS-2 (SUTURE) ×2 IMPLANT
SUT VIC AB 0 CT1 27XCR 8 STRN (SUTURE) ×2 IMPLANT
SUT VIC AB 2-0 CT1 18 (SUTURE) ×2 IMPLANT
SYR 30ML LL (SYRINGE) ×4 IMPLANT
SYR 3ML LL SCALE MARK (SYRINGE) ×2 IMPLANT
TRAP FLUID SMOKE EVACUATOR (MISCELLANEOUS) ×2 IMPLANT
WATER STERILE IRR 500ML POUR (IV SOLUTION) IMPLANT

## 2023-12-12 NOTE — Transfer of Care (Signed)
 Immediate Anesthesia Transfer of Care Note  Patient: Meagan Carr  Procedure(s) Performed: L4-5 DECOMPRESSION (Spine Lumbar)  Patient Location: PACU  Anesthesia Type:General  Level of Consciousness: drowsy and patient cooperative  Airway & Oxygen Therapy: Patient Spontanous Breathing and Patient connected to face mask oxygen  Post-op Assessment: Report given to RN, Post -op Vital signs reviewed and stable, and Patient moving all extremities X 4  Post vital signs: Reviewed and stable  Last Vitals:  Vitals Value Taken Time  BP 146/112 12/12/23 11:16  Temp 36.3 C 12/12/23 11:16  Pulse 90 12/12/23 11:18  Resp 19 12/12/23 11:18  SpO2 98 % 12/12/23 11:18  Vitals shown include unfiled device data.  Last Pain:  Vitals:   12/12/23 0830  TempSrc: Oral  PainSc: 0-No pain         Complications: No notable events documented.

## 2023-12-12 NOTE — Anesthesia Preprocedure Evaluation (Addendum)
 Anesthesia Evaluation  Patient identified by MRN, date of birth, ID band Patient awake    Reviewed: Allergy & Precautions, NPO status , Patient's Chart, lab work & pertinent test results  History of Anesthesia Complications Negative for: history of anesthetic complications  Airway Mallampati: III   Neck ROM: Full    Dental no notable dental hx.    Pulmonary neg pulmonary ROS   Pulmonary exam normal breath sounds clear to auscultation       Cardiovascular hypertension, Normal cardiovascular exam Rhythm:Regular Rate:Normal  Echo 01/25/23:  NORMAL LEFT VENTRICULAR SYSTOLIC FUNCTION   WITH MILD LVH  NORMAL RIGHT VENTRICULAR SYSTOLIC FUNCTION  MILD VALVULAR REGURGITATION  NO VALVULAR STENOSIS  MILD-MODERATE AR  ESTIMATED LVEF >55%  CALCULATED: 54.5%     Neuro/Psych negative neurological ROS     GI/Hepatic ,GERD  ,,Fatty liver   Endo/Other    Class 3 obesityPrediabetes   Renal/GU negative Renal ROS     Musculoskeletal   Abdominal   Peds  Hematology negative hematology ROS (+)   Anesthesia Other Findings   Reproductive/Obstetrics                              Anesthesia Physical Anesthesia Plan  ASA: 3  Anesthesia Plan: General   Post-op Pain Management:    Induction: Intravenous  PONV Risk Score and Plan: 3 and Ondansetron , Dexamethasone  and Treatment may vary due to age or medical condition  Airway Management Planned: Oral ETT  Additional Equipment:   Intra-op Plan:   Post-operative Plan: Extubation in OR  Informed Consent: I have reviewed the patients History and Physical, chart, labs and discussed the procedure including the risks, benefits and alternatives for the proposed anesthesia with the patient or authorized representative who has indicated his/her understanding and acceptance.     Dental advisory given  Plan Discussed with: CRNA  Anesthesia Plan Comments:  (Patient consented for risks of anesthesia including but not limited to:  - adverse reactions to medications - damage to eyes, teeth, lips or other oral mucosa - nerve damage due to positioning  - sore throat or hoarseness - damage to heart, brain, nerves, lungs, other parts of body or loss of life  Informed patient about role of CRNA in peri- and intra-operative care.  Patient voiced understanding.)         Anesthesia Quick Evaluation

## 2023-12-12 NOTE — Op Note (Addendum)
 Indications: [name] is suffering from lumbar stenosis causing neurogenic claudication (ICD10 M48.062). The patient tried and failed conservative management, prompting surgical intervention.  Please note that this was a planned second admission for surgical intervention on her lower back.  Her prior admission was for neck surgery.  Findings: stenosis impacting the left side of the spinal canal  Preoperative Diagnosis: Lumbar Stenosis with neurogenic claudication Postoperative Diagnosis: same   EBL: 10 ml IVF:see anesthesia record Drains: none Disposition: Extubated and Stable to PACU Complications: none  No foley catheter was placed.   Preoperative Note:  Risks of surgery discussed include: infection, bleeding, stroke, coma, death, paralysis, CSF leak, nerve/spinal cord injury, numbness, tingling, weakness, complex regional pain syndrome, recurrent stenosis and/or disc herniation, vascular injury, development of instability, neck/back pain, need for further surgery, persistent symptoms, development of deformity, and the risks of anesthesia. The patient understood these risks and agreed to proceed.  Operative Note:   1. L4-5 lumbar decompression   The patient was then brought from the preoperative center with intravenous access established.  The patient underwent general anesthesia and endotracheal tube intubation, and was then rotated on the [place] rail top where all pressure points were appropriately padded.  The skin was then thoroughly cleansed.  Perioperative antibiotic prophylaxis was administered.  Sterile prep and drapes were then applied and a timeout was then observed.  C-arm was brought into the field under sterile conditions and under lateral visualization the L4-5 interspace was identified and marked.  The incision was marked and injected with local anesthetic. Once this was complete a 4 cm incision was opened with the use of a #10 blade knife.    The metrx tubes  were sequentially advanced and confirmed in position at L4-5. An 18mm by 70mm tube was locked in place to the bed side attachment.  The microscope was then sterilely brought into the field and muscle creep was hemostased with a bipolar and resected with a pituitary rongeur.  A Bovie extender was then used to expose the spinous process and lamina.  Careful attention was placed to not violate the facet capsule. A 3 mm matchstick drill bit was then used to make a hemi-laminotomy trough until the ligamentum flavum was exposed.  This was extended to the base of the spinous process and to the contralateral side to remove all the central bone from each side.  Once this was complete and the underlying ligamentum flavum was visualized, it was dissected with a curette and resected with Kerrison rongeurs.  Extensive ligamentum hypertrophy was noted, requiring a substantial amount of time and care for removal.  The dura was identified and palpated. The kerrison rongeur was then used to remove the medial facet bilaterally until no compression was noted.  A balltip probe was used to confirm decompression of the left L5 nerve root.  Additional attention was paid to completion of the contralateral L4-5 foraminotomy until the contralateral traversing nerve root was completely free.  Once this was complete, L4-5 central decompression including medial facetectomy and foraminotomy was confirmed and decompression was confirmed. No CSF leak was noted.  Depo-Medrol  was placed on the nerve root.  The wound was copiously irrigated. The tube system was then removed under microscopic visualization and hemostasis was obtained with a bipolar.    The fascial layer was reapproximated with the use of a 0 Vicryl suture.  Subcutaneous tissue layer was reapproximated using 2-0 Vicryl suture.  3-0 monocryl was placed in subcuticular fashion. The skin was then cleansed  and Dermabond was used to close the skin opening.  Patient was then rotated  back to the preoperative bed awakened from anesthesia and taken to recovery all counts are correct in this case.  I performed the entire procedure with the assistance of Edsel Goods PA as an Designer, television/film set. An assistant was required for this procedure due to the complexity.  The assistant provided assistance in tissue manipulation and suction, and was required for the successful and safe performance of the procedure. I performed the critical portions of the procedure.   Karra Pink K. Clois MD

## 2023-12-12 NOTE — Anesthesia Postprocedure Evaluation (Signed)
 Anesthesia Post Note  Patient: Meagan Carr  Procedure(s) Performed: L4-5 DECOMPRESSION (Spine Lumbar)  Patient location during evaluation: PACU Anesthesia Type: General Level of consciousness: awake and alert, oriented and patient cooperative Pain management: pain level controlled Vital Signs Assessment: post-procedure vital signs reviewed and stable Respiratory status: spontaneous breathing, nonlabored ventilation and respiratory function stable Cardiovascular status: blood pressure returned to baseline and stable Postop Assessment: adequate PO intake Anesthetic complications: no   No notable events documented.   Last Vitals:  Vitals:   12/12/23 1145 12/12/23 1200  BP: (!) 152/96 (!) 154/89  Pulse: 79 71  Resp: 18 18  Temp:    SpO2: 95% 97%    Last Pain:  Vitals:   12/12/23 1200  TempSrc:   PainSc: Asleep                 Alfonso Ruths

## 2023-12-12 NOTE — H&P (Signed)
 Referring Physician:  Clois Fret, MD [address] [place],  [place] [address]  Primary Physician:  Dot Boot, PA-C  History of Present Illness: 12/12/2023 Meagan Carr presents today for low back surgery.  This is a planned 2nd surgery within the global period for the first neck surgery.  10/17/2023 Meagan Carr presents today for surgery.   07/26/2023 Meagan Carr is here today with a chief complaint of imbalance, tingling in her hands, loss of dexterity, and loss of upper extremity strength.  She has been having worsening symptoms over the past year.  She is additionally having some pain into her arms consistent with a pinched nerve.  She has tried physical therapy and injections without improvement.  Her symptoms have been worsening.  In addition to above, she is also having left leg pain and back pain.  She has difficulty with walking due to this.  She gets significant buttock and leg pain whenever she walks more than a short distance.  She then sits down which helps.    Bowel/Bladder Dysfunction: none  Conservative measures:  Physical therapy:  Has participated in PT at [facility]odal medical therapy including regular antiinflammatories:  Gabapentin , Diclofenac , Methocarbamol , Prednisone  Injections:  06/29/2023: Left L5-S1 transforaminal ESI (dexamethasone  8 mg) 12/20/2022: Left C7-T1 interlaminar ESI at [facility] (60% relief, bad headache)   Past Surgery: none  Meagan Carr has clear symptoms of cervical myelopathy.  The symptoms are causing a significant impact on the patient's life.   I have utilized the care everywhere function in epic to review the outside records available from external health systems.  Review of Systems:  A 10 point review of systems is negative, except for the pertinent positives and negatives detailed in the HPI.  Past Medical History: Past Medical History:  Diagnosis Date   Fatty liver    GERD  (gastroesophageal reflux disease)    Hyperlipidemia    Hypertension    Pneumonia    Pre-diabetes    Prediabetes    S/P colonoscopy 05/29/2006   Normal   S/P colonoscopy with polypectomy 05/30/1999   [place], polyps, unsure what type   Steatosis of liver     Past Surgical History: Past Surgical History:  Procedure Laterality Date   ABDOMINAL HYSTERECTOMY     ANTERIOR CERVICAL DECOMP/DISCECTOMY FUSION N/A 10/17/2023   Procedure: C3-6 ANTERIOR CERVICAL DISCECTOMY AND FUSION;  Surgeon: Clois Fret, MD;  Location: [facility] ORS;  Service: Neurosurgery;  Laterality: N/A;   APPENDECTOMY     c-section     X2   CATARACT EXTRACTION Left 03/20/2022   CATARACT EXTRACTION Right 03/2023   CHOLECYSTECTOMY     TEMPORAL ARTERY BIOPSY / LIGATION      Allergies: Allergies as of 07/26/2023 - Review Complete 07/26/2023  Allergen Reaction Noted   Prednisone  Other (See Comments) 01/11/2023   Latex Itching and Rash 12/12/2018    Medications:  Current Facility-Administered Medications:    0.9 %  sodium chloride  infusion, , Intravenous, Continuous, Myra Lynwood MATSU, MD, Last Rate: 10 mL/hr at 12/12/23 0850, New Bag at 12/12/23 0850   ceFAZolin  (ANCEF ) IVPB 2g/100 mL premix, 2 g, Intravenous, 60 min Pre-Op, Clois Fret, MD  Social History: Social History   Tobacco Use   Smoking status: Never   Smokeless tobacco: Never  Vaping Use   Vaping status: Never Used  Substance Use Topics   Alcohol use: No    Comment: spec occ   Drug use: No    Family Medical  History: Family History  Problem Relation Age of Onset   Breast cancer Mother        metastasized to bones, deceased age 73   Brain cancer Father        deceased age 37   Breast cancer Sister    Breast cancer Maternal Aunt    Breast cancer Maternal Grandmother    Colon cancer Neg Hx     Physical Examination: Vitals:   12/12/23 0830  BP: 126/89  Pulse: 80  Resp: 14  Temp: 98 F (36.7 C)  SpO2: 93%   Heart sounds  normal no MRG. Chest Clear to Auscultation Bilaterally.  General: Patient is in no apparent distress. Attention to examination is appropriate.  Neck:   Supple.  Full range of motion.  Respiratory: Patient is breathing without any difficulty.   NEUROLOGICAL:     Awake, alert, oriented to person, place, and time.  Speech is clear and fluent.   Cranial Nerves: Pupils equal round and reactive to light.  Facial tone is symmetric.  Facial sensation is symmetric. Shoulder shrug is symmetric. Tongue protrusion is midline.  There is no pronator drift.  Strength: Side Biceps Triceps Deltoid Interossei Grip Wrist Ext. Wrist Flex.  R 5 5 5 5 4  4+ 5  L 5 5 5 5 4  4+ 5   Side Iliopsoas Quads Hamstring PF DF EHL  R 5 5 5 5 5 5   L 5 5 5 5 5 5    Reflexes are 2+ and symmetric at the biceps, triceps, brachioradialis, patella and achilles.   Hoffman's is present.   Bilateral upper and lower extremity sensation is intact to light touch.    No evidence of dysmetria noted.  Gait is wide-based and very abnormal.  She is unable to perform tandem gait..     Medical Decision Making  Imaging: MRI C spine 11/21/2022 C3-C4: Small central disc protrusion. Right greater than facet and uncovertebral arthropathy. Moderate canal stenosis with moderate right foraminal stenosis. Findings have progressed from prior.   C4-C5: Broad-based disc bulge with right greater than left facet and uncovertebral arthropathy. Moderate canal stenosis with moderate right and mild left foraminal stenosis. No significant interval progression.   C5-C6: Disc osteophyte complex with bilateral facet and uncovertebral arthropathy. Moderate canal stenosis with severe right and moderate left foraminal stenosis. No significant interval progression.   C6-C7: Disc osteophyte complex with left greater than right uncovertebral spurring and mild facet hypertrophy. Mild canal stenosis with moderate bilateral foraminal stenosis. No  significant interval progression.   C7-T1: Bilateral facet hypertrophy and mild uncovertebral spurring. Mild left foraminal stenosis. No canal stenosis. No significant interval progression.   IMPRESSION: 1. Multilevel cervical spondylosis with moderate canal stenosis at C3-4, C4-5, and C5-6. 2. Multilevel foraminal stenosis, severe on the right at C5-6.     Electronically Signed   By: Mabel Converse D.O.   On: 11/23/2022 16:17 MRI L spine 06/13/2023 L1-2: Normal disc. Mild-to-moderate facet and ligamentum flavum hypertrophy without stenosis.   L2-3: Minimal disc bulging and moderate facet and ligamentum flavum hypertrophy without stenosis.   L3-4: Mild disc bulging eccentric to the left and moderate facet and ligamentum flavum hypertrophy result in mild left neural foraminal stenosis without spinal stenosis.   L4-5: Disc bulging, congenitally short pedicles, prominent dorsal epidural fat, and moderate right and severe left facet and ligamentum flavum hypertrophy result in severe spinal stenosis, left greater than right lateral recess stenosis, and moderate bilateral neural foraminal stenosis.   L5-S1: Minimal  disc bulging and mild facet hypertrophy without stenosis. Multilevel   IMPRESSION: 1. Lumbar spondylosis and facet arthrosis, most notable at L4-5 where there is severe spinal stenosis and moderate neural foraminal stenosis. 2. Mild spinal stenosis and moderate to severe right neural foraminal stenosis at T11-12.     Electronically Signed   By: Dasie Hamburg M.D.   On: 06/15/2023 15:23 I have personally reviewed the images and agree with the above interpretation.  Assessment and Plan: Meagan Carr is a pleasant 68 y.o. 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    She also has lumbar stenosis and presents for planned lumbar surgery.      Thank you for involving me in the care of this patient.      Ajay Strubel K. Clois  MD, [facility]surgery

## 2023-12-12 NOTE — Anesthesia Procedure Notes (Signed)
 Procedure Name: Intubation Date/Time: 12/12/2023 9:22 AM  Performed by: Lennie Lamarr HERO, CRNAPre-anesthesia Checklist: Patient identified, Emergency Drugs available, Suction available and Patient being monitored Patient Re-evaluated:Patient Re-evaluated prior to induction Oxygen Delivery Method: Circle System Utilized Preoxygenation: Pre-oxygenation with 100% oxygen Induction Type: IV induction Ventilation: Mask ventilation without difficulty Laryngoscope Size: McGrath and 3 Grade View: Grade I Tube type: Oral Tube size: 7.0 mm Number of attempts: 1 Airway Equipment and Method: Stylet and Video-laryngoscopy Placement Confirmation: ETT inserted through vocal cords under direct vision, positive ETCO2 and breath sounds checked- equal and bilateral Secured at: 21 cm Tube secured with: Tape Dental Injury: Teeth and Oropharynx as per pre-operative assessment

## 2023-12-12 NOTE — Discharge Instructions (Addendum)

## 2023-12-12 NOTE — Discharge Summary (Signed)
 Discharge Summary  Patient ID: [name] MRN: [id number] DOB/AGE: [id number] y.o.  Admit date: 12/12/2023 Discharge date: 12/12/2023  Admission Diagnoses: neurogenic claudication (ICD10 M48.062)  Discharge Diagnoses:  Active Problems:   Spinal stenosis, lumbar region, with neurogenic claudication   Discharged Condition: good  Hospital Course:  Meagan Carr is a 68 y.o presenting with lumbar stenosis s/p L4-5 decompression. Her intraoperative course was uncomplicated. She was monitored in PACU and discharged home after ambulating, urinating, and tolerating PO intake. She was given prescriptions for pain medication and instructed to resume her home muscle relaxer and stool softener.   Consults: None  Significant Diagnostic Studies: NA  Treatments: surgery: as above. Please see separately dictated operative report for further details   Discharge Exam: Blood pressure 126/89, pulse 80, temperature 98 F (36.7 C), temperature source Oral, resp. rate 14, height 5' 3 (1.6 m), weight 101.2 kg, SpO2 93%. CN grossly intact MAEW Incision c/d/I with dressing in place  Disposition: Discharge disposition: 01-Home or Self Care        Allergies as of 12/12/2023       Reactions   Prednisone  Other (See Comments)   migraine   Latex Itching, Rash        Medication List     TAKE these medications    acetaminophen  500 MG tablet Commonly known as: TYLENOL  Take 2 tablets (1,000 mg total) by mouth every 8 (eight) hours. What changed:  when to take this reasons to take this   albuterol  108 (90 Base) MCG/ACT inhaler Commonly known as: VENTOLIN  HFA Inhale 1-2 puffs into the lungs every 6 (six) hours as needed for wheezing or shortness of breath.   atorvastatin  20 MG tablet Commonly known as: LIPITOR Take 20 mg by mouth at bedtime.   celecoxib  100 MG capsule Commonly known as: CeleBREX  Take 1 capsule (100 mg total) by mouth 2 (two) times daily.   gabapentin  300 MG  capsule Commonly known as: Neurontin  Take 1 capsule (300 mg total) by mouth 3 (three) times daily. What changed:  how much to take when to take this   losartan  50 MG tablet Commonly known as: COZAAR  Take 50 mg by mouth at bedtime.   metFORMIN  500 MG 24 hr tablet Commonly known as: GLUCOPHAGE -XR Take 500 mg by mouth at bedtime.   metoprolol  succinate 25 MG 24 hr tablet Commonly known as: TOPROL -XL Take 25 mg by mouth at bedtime.   oxyCODONE  5 MG immediate release tablet Commonly known as: Oxy IR/ROXICODONE  Take 1 tablet (5 mg total) by mouth every 4 (four) hours as needed for severe pain (pain score 7-10).   pantoprazole  40 MG tablet Commonly known as: PROTONIX  Take 40 mg by mouth at bedtime.   senna 8.6 MG Tabs tablet Commonly known as: SENOKOT Take 1 tablet (8.6 mg total) by mouth 2 (two) times daily as needed for mild constipation. What changed: when to take this   tiZANidine  4 MG tablet Commonly known as: Zanaflex  Take 1 tablet (4 mg total) by mouth every 8 (eight) hours as needed.         Signed: Edsel Jama Goods 12/12/2023, 11:03 AM

## 2023-12-13 ENCOUNTER — Encounter: Payer: Self-pay | Admitting: Neurosurgery

## 2023-12-14 ENCOUNTER — Telehealth: Payer: Self-pay

## 2023-12-14 ENCOUNTER — Other Ambulatory Visit: Payer: Self-pay | Admitting: Neurosurgery

## 2023-12-14 MED ORDER — BUTALBITAL-APAP-CAFFEINE 50-325-40 MG PO TABS
1.0000 | ORAL_TABLET | Freq: Four times a day (QID) | ORAL | 0 refills | Status: AC | PRN
Start: 2023-12-14 — End: 2023-12-17

## 2023-12-14 NOTE — Progress Notes (Signed)
 Pt called with non positional headaches. Denies any drainage from her incision. Does not want to take Oxycodone  for this. Fioricet sent in

## 2023-12-14 NOTE — Telephone Encounter (Addendum)
 Ms Pilling called in to report a headache since she woke up from surgery. She states her head hurts worse than her back and has caused her to feel nauseous. The headache is not positional. The pain is across the front of her head (forehead) and the headache feels like the type of headache she gets when she takes prednisone .   She is also complaining of itching on her arms and legs. She states that she had hives on her arms, but they aren't as bad after she put alcohol on the hives.  She also states that she took the compression stockings off because they were making her legs itch. The itching improved after this. I encouraged her to make sure she is walking around every 1-2 hours if she is not going to wear the stockings.  She is taking tylenol  and tizanidine . She hasn't taken any oxycodone .  I reviewed the above with Edsel Goods, PA-C. She was offered Fioricet versus tylenol  and oxycodone . She elected to try Fioricet. I informed her that this can make her drowsy.   She inquired about using a heating pad, which I advised her to limit to 20 minutes at a time and avoid putting it near her incision.  She also inquired about using aloe on her neck incision, which I informed her that Edsel said was OK.  I encouraged her to call our # for the answering service to request to page the on call provider if she has an urgent concerns over the weekend.

## 2023-12-21 NOTE — Progress Notes (Signed)
   REFERRING PHYSICIAN:  Dot Boot, Pa-c [address] [place],  [place] [address]  DOS: 12/12/23 L4-L5 decompression  DOS: 10/17/23 ACDF C3-C6 for cervical myelopathy   HISTORY OF PRESENT ILLNESS: Meagan Carr is approximately 2 weeks status post above lumbar surgery. Was given oxycodone  on discharge from the hospital. Had zanaflex  and celebrex  already.   She called a few days postop with headaches- was sent fioricet which helped. Headaches have resolved.   She continues with intermittent left leg pain (entire leg) and right sided low back pain. She feels like left leg is a little better since the surgery. No numbness, tingling, or weakness in her legs.   She also has neck and posterior shoulder pain. She turned in bed last week and is worried that she messed it up. She feels some pressure in her neck as well. She has some pain in both shoulders but no arm pain. She is having some triggering of her right thumb, index and middle finger. She is back in her collar since her pain was worse.   She is taking oxycodone  prn.   PHYSICAL EXAMINATION:  General: Patient is well developed, well nourished, calm, collected, and in no apparent distress.   NEUROLOGICAL:  General: In no acute distress.   Awake, alert, oriented to person, place, and time.  Pupils equal round and reactive to light.  Facial tone is symmetric.     Strength:  Side Biceps Triceps Deltoid Interossei Grip Wrist Ext. Wrist Flex.  R 5 5 5 5 5 5 5   L 5 5 5 5 5 5 5            Side Iliopsoas Quads Hamstring PF DF EHL  R 5 5 5 5 5 5   L 5 5 5 5 5 5    Lumbar incision c/d/I  Cervical incision well healed.   She has mild posterior cervical and scapular tenderness.   She has triggering of right thumb, index, and middle fingers.    ROS (Neurologic):  Negative except as noted above  IMAGING: Nothing new to review.   ASSESSMENT/PLAN:  Meagan Carr is doing fair s/p above surgeries. Treatment options  reviewed with patient and following plan made:   - I have advised the patient to lift up to 10 pounds until 6 weeks after surgery (follow up with Dr. Clois).  - Reviewed wound care.  - No bending, twisting, or lifting.  - Continue on current medications including prn oxycodone , refill given. PMP reviewed and is appropriate.  - Xrays of cervical spine ordered to get on her way out. Will message her with results.  - Referral to [facility]o at [facility] for right trigger fingers.   - Follow up as scheduled in 4 weeks and prn.   Blood pressure was on lower side. She feels good. No signs of dizziness or feeling light headed.   Advised to contact the office if any questions or concerns arise.  Glade Boys PA-C Department of neurosurgery

## 2023-12-27 ENCOUNTER — Encounter: Payer: Self-pay | Admitting: Orthopedic Surgery

## 2023-12-27 ENCOUNTER — Ambulatory Visit (INDEPENDENT_AMBULATORY_CARE_PROVIDER_SITE_OTHER): Admitting: Orthopedic Surgery

## 2023-12-27 ENCOUNTER — Ambulatory Visit
Admission: RE | Admit: 2023-12-27 | Discharge: 2023-12-27 | Disposition: A | Discharge status: Home / Self Care | Attending: Neurosurgery | Admitting: Neurosurgery

## 2023-12-27 ENCOUNTER — Ambulatory Visit
Admission: RE | Admit: 2023-12-27 | Discharge: 2023-12-27 | Disposition: A | Discharge status: Home / Self Care | Source: Ambulatory Visit | Attending: Neurosurgery | Admitting: Neurosurgery

## 2023-12-27 VITALS — BP 95/63 | HR 94 | Temp 97.6°F

## 2023-12-27 DIAGNOSIS — G959 Disease of spinal cord, unspecified: Secondary | ICD-10-CM | POA: Insufficient documentation

## 2023-12-27 DIAGNOSIS — Z981 Arthrodesis status: Secondary | ICD-10-CM

## 2023-12-27 DIAGNOSIS — M6530 Trigger finger, unspecified finger: Secondary | ICD-10-CM

## 2023-12-27 DIAGNOSIS — M4802 Spinal stenosis, cervical region: Secondary | ICD-10-CM

## 2023-12-27 DIAGNOSIS — Z09 Encounter for follow-up examination after completed treatment for conditions other than malignant neoplasm: Secondary | ICD-10-CM

## 2023-12-27 DIAGNOSIS — M48062 Spinal stenosis, lumbar region with neurogenic claudication: Secondary | ICD-10-CM

## 2023-12-27 MED ORDER — OXYCODONE HCL 5 MG PO TABS: 5.0000 mg | ORAL_TABLET | Freq: Four times a day (QID) | ORAL | 0 refills | Status: AC | PRN

## 2023-12-27 NOTE — Patient Instructions (Signed)
 It was nice to see you today.   I am glad that you are feeling better!   Okay to get incision wet in the shower, do not submerge in pool or hot tub.   Call if any concerns about the incision such as redness, drainage, or fever/chills.   No bending, twisting, or lifting. You can lift up to 10 pounds until your follow up with Dr.  Clois in 4 weeks.   I sent a refill of oxycodone  to your pharmacy. Continue to take the least amount needed and only take for severe pain. Remember this medication can make you sleepy and/or constipated.   I put in a referral for you to see ortho at [facility] for your trigger fingers. They should call you or you can call them at [phone].  We will see you back in 4 weeks for your 6 weeks postop visit. You will need xrays prior to this visit.   Please call with any questions or concerns.   Glade Boys PA-C [phone]     The physicians and staff at [facility] Neurosurgery at [facility] are committed to providing excellent care. You may receive a survey asking for feedback about your experience at our office. We value you your feedback and appreciate you taking the time to to fill it out. The [facility] leadership team is also available to discuss your experience in person, feel free to contact us  [phone].

## 2024-01-03 ENCOUNTER — Encounter: Admitting: Orthopedic Surgery

## 2024-01-03 ENCOUNTER — Encounter: Payer: Medicare HMO | Admitting: Orthopedic Surgery

## 2024-01-24 ENCOUNTER — Ambulatory Visit (INDEPENDENT_AMBULATORY_CARE_PROVIDER_SITE_OTHER): Admitting: Neurosurgery

## 2024-01-24 ENCOUNTER — Encounter: Payer: Self-pay | Admitting: Neurosurgery

## 2024-01-24 VITALS — BP 142/88 | Temp 97.6°F | Ht 63.0 in | Wt 223.0 lb

## 2024-01-24 DIAGNOSIS — M48062 Spinal stenosis, lumbar region with neurogenic claudication: Secondary | ICD-10-CM

## 2024-01-24 DIAGNOSIS — G959 Disease of spinal cord, unspecified: Secondary | ICD-10-CM

## 2024-01-24 DIAGNOSIS — Z981 Arthrodesis status: Secondary | ICD-10-CM

## 2024-01-24 NOTE — Progress Notes (Signed)
   REFERRING PHYSICIAN:  Dot Boot, Pa-c [address] [place],  [place] [address]  DOS: 12/12/23 L4-L5 decompression  DOS: 10/17/23 ACDF C3-C6 for cervical myelopathy   HISTORY OF PRESENT ILLNESS: Meagan Carr isstatus post above lumbar surgery.   She is doing fair.  She has increased ability to walk, but is having some back and neck pain prickly when she is sedentary.  She has a trigger finger on the right side.  PHYSICAL EXAMINATION:  General: Patient is well developed, well nourished, calm, collected, and in no apparent distress.   NEUROLOGICAL:  General: In no acute distress.   Awake, alert, oriented to person, place, and time.  Pupils equal round and reactive to light.  Facial tone is symmetric.     Strength:  Side Biceps Triceps Deltoid Interossei Grip Wrist Ext. Wrist Flex.  R 5 5 5 5 5 5 5   L 5 5 5 5 5 5 5            Side Iliopsoas Quads Hamstring PF DF EHL  R 5 5 5 5 5 5   L 5 5 5 5 5 5    Lumbar incision c/d/I  Cervical incision well healed.   ROS (Neurologic):  Negative except as noted above  IMAGING: Nothing new to review.   ASSESSMENT/PLAN:  Meagan Carr is doing fair s/p above surgeries.   I will send her for physical therapy.  Have encouraged her to take her Celebrex  for now and switch to ibuprofen  when she finishes her Celebrex .  I will contact Dr. Kathlynn to see if we can expedite her appointment for her trigger finger.  We will see her back in 6 weeks.     Reeves Daisy MD Department of neurosurgery

## 2024-01-29 DIAGNOSIS — G4733 Obstructive sleep apnea (adult) (pediatric): Secondary | ICD-10-CM | POA: Diagnosis not present

## 2024-01-29 DIAGNOSIS — R7303 Prediabetes: Secondary | ICD-10-CM | POA: Diagnosis not present

## 2024-01-29 DIAGNOSIS — R079 Chest pain, unspecified: Secondary | ICD-10-CM | POA: Diagnosis not present

## 2024-01-29 DIAGNOSIS — E782 Mixed hyperlipidemia: Secondary | ICD-10-CM | POA: Diagnosis not present

## 2024-01-29 DIAGNOSIS — I10 Essential (primary) hypertension: Secondary | ICD-10-CM | POA: Diagnosis not present

## 2024-01-29 DIAGNOSIS — R600 Localized edema: Secondary | ICD-10-CM | POA: Diagnosis not present

## 2024-01-29 DIAGNOSIS — K219 Gastro-esophageal reflux disease without esophagitis: Secondary | ICD-10-CM | POA: Diagnosis not present

## 2024-02-01 ENCOUNTER — Other Ambulatory Visit: Payer: Self-pay | Admitting: Internal Medicine

## 2024-02-01 DIAGNOSIS — I10 Essential (primary) hypertension: Secondary | ICD-10-CM

## 2024-02-01 DIAGNOSIS — R079 Chest pain, unspecified: Secondary | ICD-10-CM

## 2024-02-01 DIAGNOSIS — G4733 Obstructive sleep apnea (adult) (pediatric): Secondary | ICD-10-CM

## 2024-02-04 DIAGNOSIS — R1032 Left lower quadrant pain: Secondary | ICD-10-CM | POA: Diagnosis not present

## 2024-02-04 DIAGNOSIS — R61 Generalized hyperhidrosis: Secondary | ICD-10-CM | POA: Diagnosis not present

## 2024-02-04 DIAGNOSIS — R10819 Abdominal tenderness, unspecified site: Secondary | ICD-10-CM | POA: Diagnosis not present

## 2024-02-07 DIAGNOSIS — R399 Unspecified symptoms and signs involving the genitourinary system: Secondary | ICD-10-CM | POA: Diagnosis not present

## 2024-02-07 DIAGNOSIS — R10819 Abdominal tenderness, unspecified site: Secondary | ICD-10-CM | POA: Diagnosis not present

## 2024-02-07 DIAGNOSIS — R61 Generalized hyperhidrosis: Secondary | ICD-10-CM | POA: Diagnosis not present

## 2024-02-07 DIAGNOSIS — R1032 Left lower quadrant pain: Secondary | ICD-10-CM | POA: Diagnosis not present

## 2024-02-11 DIAGNOSIS — Z117 Encounter for testing for latent tuberculosis infection: Secondary | ICD-10-CM | POA: Diagnosis not present

## 2024-02-11 DIAGNOSIS — K111 Hypertrophy of salivary gland: Secondary | ICD-10-CM | POA: Diagnosis not present

## 2024-02-11 DIAGNOSIS — K219 Gastro-esophageal reflux disease without esophagitis: Secondary | ICD-10-CM | POA: Diagnosis not present

## 2024-02-11 DIAGNOSIS — R0789 Other chest pain: Secondary | ICD-10-CM | POA: Diagnosis not present

## 2024-02-11 DIAGNOSIS — G479 Sleep disorder, unspecified: Secondary | ICD-10-CM | POA: Diagnosis not present

## 2024-02-11 DIAGNOSIS — E669 Obesity, unspecified: Secondary | ICD-10-CM | POA: Diagnosis not present

## 2024-02-11 DIAGNOSIS — R0683 Snoring: Secondary | ICD-10-CM | POA: Diagnosis not present

## 2024-02-12 ENCOUNTER — Other Ambulatory Visit: Payer: Self-pay | Admitting: Nurse Practitioner

## 2024-02-12 DIAGNOSIS — R1032 Left lower quadrant pain: Secondary | ICD-10-CM

## 2024-02-12 DIAGNOSIS — R10819 Abdominal tenderness, unspecified site: Secondary | ICD-10-CM

## 2024-02-12 DIAGNOSIS — R61 Generalized hyperhidrosis: Secondary | ICD-10-CM

## 2024-02-21 ENCOUNTER — Ambulatory Visit: Admission: RE | Admit: 2024-02-21 | Source: Ambulatory Visit

## 2024-02-29 NOTE — Progress Notes (Signed)
   REFERRING PHYSICIAN:  Dot Boot, Pa-c [address] [place],  [place] [address]  DOS: 12/12/23 L4-L5 decompression  DOS: 10/17/23 ACDF C3-C6 for cervical myelopathy   HISTORY OF PRESENT ILLNESS:  She was doing fair at her last visit. Her walking had improved, but she continues with back and neck pain when sitting.   She was sent to PT at her last visit. She was to finish celebrex  and then start ibuprofen . She has not yet seen ortho Meagan Carr) for her trigger fingers.   She has not started PT- was not called. Would like to go to [facility].   She continues with intermittent LBP that is more of a pressure. She has intermittent left leg pain (medial and lateral) to her foot.   She still has some left sided neck and arm pain to her left elbow. Still having pain in right hand. Has not see ortho.   She has urinary urgency. Was at movies over the weekend and had liquid BM without realizing it. No perineal numbness. No other bowel issues.    PHYSICAL EXAMINATION:  General: Patient is well developed, well nourished, calm, collected, and in no apparent distress.   NEUROLOGICAL:  General: In no acute distress.   Awake, alert, oriented to person, place, and time.  Pupils equal round and reactive to light.  Facial tone is symmetric.    Strength:  Side Biceps Triceps Deltoid Interossei Grip Wrist Ext. Wrist Flex.  R 5 5 5 5 5 5 5   L 5 5 5 5 5 5 5            Side Iliopsoas Quads Hamstring PF DF EHL  R 5 5 5 5 5 5   L 5 5 5 5 5 5    Lumbar incision well healed.   Cervical incision well healed.   She has painful ROM of left shoulder.   She has pain over A1 pulley right thumb, index, and middle fingers. No triggering noted today.    ROS (Neurologic):  Negative except as noted above  IMAGING: Nothing new to review.   ASSESSMENT/PLAN:  Meagan Carr is doing fair s/p above surgeries. She continues with neck and left arm pain to elbow. She has intermittent LBP and left leg pain.    She had one episode of having liquid BM without realizing it over the weekend. No perineal numbness, no urinary issues. No other bowel issues. No weakness in legs on exam. This does not appear to be lumbar mediated.   Treatment options reviewed with patient and following plan made:   - Continue to recommend PT for cervical and lumbar spine. She wants to go to [facility]- will see if Benton can do the orders.  - Some of her left shoulder pain may be shoulder mediated, will see how she does with PT.  - She can slowly return to activity as tolerated regarding her back. - Referral to ortho at [facility] for right trigger fingers.   - Follow up with PCP regarding bowels. Let me know if she has any other issues.  - Follow up with Dr. Clois in 4 months for postop for her neck. Will need xrays prior.   Advised to contact the office if any questions or concerns arise.  Meagan Boys PA-C Department of neurosurgery

## 2024-03-04 ENCOUNTER — Ambulatory Visit: Admitting: Orthopedic Surgery

## 2024-03-04 ENCOUNTER — Encounter: Payer: Self-pay | Admitting: Orthopedic Surgery

## 2024-03-04 VITALS — BP 128/82 | Temp 98.1°F | Wt 219.0 lb

## 2024-03-04 DIAGNOSIS — Z981 Arthrodesis status: Secondary | ICD-10-CM | POA: Diagnosis not present

## 2024-03-04 DIAGNOSIS — Z4889 Encounter for other specified surgical aftercare: Secondary | ICD-10-CM

## 2024-03-04 DIAGNOSIS — G959 Disease of spinal cord, unspecified: Secondary | ICD-10-CM

## 2024-03-04 DIAGNOSIS — M6530 Trigger finger, unspecified finger: Secondary | ICD-10-CM

## 2024-03-04 DIAGNOSIS — M25522 Pain in left elbow: Secondary | ICD-10-CM

## 2024-03-04 DIAGNOSIS — M79605 Pain in left leg: Secondary | ICD-10-CM | POA: Diagnosis not present

## 2024-03-04 DIAGNOSIS — M545 Low back pain, unspecified: Secondary | ICD-10-CM

## 2024-03-04 DIAGNOSIS — M542 Cervicalgia: Secondary | ICD-10-CM

## 2024-03-04 DIAGNOSIS — Z09 Encounter for follow-up examination after completed treatment for conditions other than malignant neoplasm: Secondary | ICD-10-CM

## 2024-03-04 DIAGNOSIS — M48062 Spinal stenosis, lumbar region with neurogenic claudication: Secondary | ICD-10-CM

## 2024-03-04 NOTE — Patient Instructions (Signed)
 It was so nice to see you today. Thank you so much for coming in.    I will see if Benton can order PT at the [facility] and let you know. You can call them at [phone].  I want you to see ortho at the [facility] clinic for evaluation of your right hand. They should call you to schedule an appointment or you can call them at [phone].   I would follow up with your PCP about your bowels. Let me know if you have any other issues with using the bathroom.   Dr. Clois will see you back in 4 months for your neck. Please do not hesitate to call if you have any questions or concerns. You can also message me in MyChart.   Glade Boys PA-C [phone]     The physicians and staff at [facility]surgery at [facility] are committed to providing excellent care. You may receive a survey asking for feedback about your experience at our office. We value you your feedback and appreciate you taking the time to to fill it out. The [facility] leadership team is also available to discuss your experience in person, feel free to contact us  [phone].

## 2024-03-07 ENCOUNTER — Telehealth (HOSPITAL_COMMUNITY): Payer: Self-pay | Admitting: Emergency Medicine

## 2024-03-07 ENCOUNTER — Encounter (HOSPITAL_COMMUNITY): Payer: Self-pay

## 2024-03-07 NOTE — Telephone Encounter (Signed)
 Reaching out to patient to offer assistance regarding upcoming cardiac imaging study; pt verbalizes understanding of appt date/time, parking situation and where to check in, pre-test NPO status and medications ordered, and verified current allergies; name and call back number provided for further questions should they arise Rockwell Alexandria RN Navigator Cardiac Imaging [facility] [phone] office [phone] cell

## 2024-03-10 ENCOUNTER — Ambulatory Visit
Admission: RE | Admit: 2024-03-10 | Discharge: 2024-03-10 | Disposition: A | Discharge status: Home / Self Care | Source: Ambulatory Visit | Attending: Internal Medicine | Admitting: Internal Medicine

## 2024-03-10 DIAGNOSIS — K449 Diaphragmatic hernia without obstruction or gangrene: Secondary | ICD-10-CM | POA: Insufficient documentation

## 2024-03-10 DIAGNOSIS — N200 Calculus of kidney: Secondary | ICD-10-CM | POA: Insufficient documentation

## 2024-03-10 DIAGNOSIS — I2510 Atherosclerotic heart disease of native coronary artery without angina pectoris: Secondary | ICD-10-CM | POA: Insufficient documentation

## 2024-03-10 DIAGNOSIS — R10819 Abdominal tenderness, unspecified site: Secondary | ICD-10-CM | POA: Insufficient documentation

## 2024-03-10 DIAGNOSIS — I10 Essential (primary) hypertension: Secondary | ICD-10-CM | POA: Insufficient documentation

## 2024-03-10 DIAGNOSIS — I200 Unstable angina: Secondary | ICD-10-CM | POA: Diagnosis not present

## 2024-03-10 DIAGNOSIS — R079 Chest pain, unspecified: Secondary | ICD-10-CM | POA: Insufficient documentation

## 2024-03-10 DIAGNOSIS — R0789 Other chest pain: Secondary | ICD-10-CM | POA: Diagnosis not present

## 2024-03-10 DIAGNOSIS — G4733 Obstructive sleep apnea (adult) (pediatric): Secondary | ICD-10-CM | POA: Insufficient documentation

## 2024-03-10 DIAGNOSIS — R1032 Left lower quadrant pain: Secondary | ICD-10-CM | POA: Insufficient documentation

## 2024-03-10 DIAGNOSIS — R61 Generalized hyperhidrosis: Secondary | ICD-10-CM | POA: Diagnosis not present

## 2024-03-10 DIAGNOSIS — R931 Abnormal findings on diagnostic imaging of heart and coronary circulation: Secondary | ICD-10-CM | POA: Diagnosis not present

## 2024-03-10 MED ORDER — IOHEXOL 350 MG/ML SOLN
100.0000 mL | Freq: Once | INTRAVENOUS | Status: AC | PRN
  Administered 2024-03-10: 100 mL via INTRAVENOUS

## 2024-03-10 MED ORDER — NITROGLYCERIN 0.4 MG SL SUBL
0.8000 mg | SUBLINGUAL_TABLET | Freq: Once | SUBLINGUAL | Status: AC
  Administered 2024-03-10: 0.8 mg via SUBLINGUAL
  Filled 2024-03-10: qty 25

## 2024-03-10 MED ORDER — METOPROLOL TARTRATE 5 MG/5ML IV SOLN
10.0000 mg | Freq: Once | INTRAVENOUS | Status: DC | PRN
  Filled 2024-03-10: qty 10

## 2024-03-10 MED ORDER — DILTIAZEM HCL 25 MG/5ML IV SOLN
10.0000 mg | INTRAVENOUS | Status: DC | PRN
  Filled 2024-03-10: qty 5

## 2024-03-10 NOTE — Progress Notes (Signed)
 Patient tolerated CT well. Vital signs stable encourage to drink water throughout day.Reasons explained and verbalized understanding. Ambulated steady gait.

## 2024-03-27 ENCOUNTER — Encounter: Payer: Self-pay | Admitting: Neurosurgery

## 2024-03-27 ENCOUNTER — Telehealth: Payer: Self-pay | Admitting: Neurosurgery

## 2024-03-27 NOTE — Telephone Encounter (Signed)
 Patient is calling to let our office know that she is having pain on the left side of her neck and throbbing for two days now. She states that it hurts to turn her neck, she has swelling under her neck on the left side from her left ear to her jaw line. She rates the pain at 9/10. She is sending a picture through [employer]

## 2024-03-27 NOTE — Telephone Encounter (Signed)
 DOS: 12/12/23 L4-L5 decompression   DOS: 10/17/23 ACDF C3-C6 for cervical myelopathy   I looked at her pictures and I don't see any appreciable swelling. It is hard to see swelling on pictures.   I recommend restarting celebrex  or ibuprofen . Let me know if she needs refill. She likely will see improvement with PT as well. Has this been scheduled (she was going to [facility])?

## 2024-03-27 NOTE — Telephone Encounter (Signed)
 I spoke with Meagan Carr. She reports the left sided neck pain is different this time because she can hardly turn her neck and it is swollen. For the past 2 days it has gotten progressively worse. She cannot think of any inciting event. She bent over to pick up a piece of paper a couple of days ago and her back and neck started bothering her after that.   She still has the following leftover (but has not tried): Methocarbamol  Tizanidine   Oxycodone  Tylenol    She is out of celebrex . She has not tried ibuprofen .   She is taking: Methocarbamol  1-2 x during the day and at bedtime (without relief)    [facility] [address]

## 2024-03-27 NOTE — Telephone Encounter (Signed)
 Addressed in another message. I reviewed her pictures and I don't see any appreciable swelling.

## 2024-03-27 NOTE — Telephone Encounter (Addendum)
 Left message to return call.  She is scheduled to start PT on 04/08/24 per [facility].  Her rx for celebrex  that was sent in June had 2 refills on it. Per the medication dispense history, this has only been filled once. She should be able to contact the pharmacy to ask them to fill it again.

## 2024-03-28 NOTE — Telephone Encounter (Signed)
 I spoke with the patient and informed her of Stacy's recommendation. I advised her to contact us  if her symptoms or getting worse/not improving.

## 2024-04-14 DIAGNOSIS — M4802 Spinal stenosis, cervical region: Secondary | ICD-10-CM | POA: Diagnosis not present

## 2024-04-17 ENCOUNTER — Ambulatory Visit

## 2024-04-17 ENCOUNTER — Telehealth: Payer: Self-pay | Admitting: Orthopedic Surgery

## 2024-04-17 ENCOUNTER — Telehealth: Payer: Self-pay | Admitting: Neurosurgery

## 2024-04-17 DIAGNOSIS — M549 Dorsalgia, unspecified: Secondary | ICD-10-CM | POA: Diagnosis not present

## 2024-04-17 DIAGNOSIS — W19XXXA Unspecified fall, initial encounter: Secondary | ICD-10-CM | POA: Diagnosis not present

## 2024-04-17 DIAGNOSIS — M545 Low back pain, unspecified: Secondary | ICD-10-CM

## 2024-04-17 DIAGNOSIS — M542 Cervicalgia: Secondary | ICD-10-CM | POA: Diagnosis not present

## 2024-04-17 DIAGNOSIS — G959 Disease of spinal cord, unspecified: Secondary | ICD-10-CM

## 2024-04-17 DIAGNOSIS — M47812 Spondylosis without myelopathy or radiculopathy, cervical region: Secondary | ICD-10-CM | POA: Diagnosis not present

## 2024-04-17 DIAGNOSIS — M47816 Spondylosis without myelopathy or radiculopathy, lumbar region: Secondary | ICD-10-CM | POA: Diagnosis not present

## 2024-04-17 DIAGNOSIS — Z981 Arthrodesis status: Secondary | ICD-10-CM | POA: Diagnosis not present

## 2024-04-17 DIAGNOSIS — M8580 Other specified disorders of bone density and structure, unspecified site: Secondary | ICD-10-CM | POA: Diagnosis not present

## 2024-04-17 DIAGNOSIS — M48061 Spinal stenosis, lumbar region without neurogenic claudication: Secondary | ICD-10-CM | POA: Diagnosis not present

## 2024-04-17 NOTE — Telephone Encounter (Signed)
 Patient advised. Patient will try Tizanidine . Patient is coming this afternoon to get Xrays done. Orders placed ok per Glade Boys.

## 2024-04-17 NOTE — Telephone Encounter (Signed)
 Please let her know that both her cervical and lumbar xrays look good. Hardware in neck looks good. No fractures seen.   Hopefully her pain will improve over next 1-2 weeks.   She should schedule follow up if no better.

## 2024-04-17 NOTE — Addendum Note (Signed)
 Addended by: GIRARD DON GAILS on: 04/17/2024 11:50 AM   Modules accepted: Orders

## 2024-04-17 NOTE — Telephone Encounter (Signed)
 She fell on her bottom and slid down the steps She says it hurts from the tailbone all the way up the spine to the neck and the left shoulder and down her left leg.  She has Oxycodone  and Tizanidine  that were prescribed in the past but she has not taken anything yet. She wanted to know what you suggested before taking any medications.  She has not had any bowel or bladder problems

## 2024-04-17 NOTE — Telephone Encounter (Signed)
 The patient fell down approximately six steps and is experiencing severe pain along her spine--from the neck down to the lower back and tailbone, with the lower back/tailbone being the most painful. She reports sharp, shock-like pains and wants to know what immediate steps to take and which provider she should see as soon as possible. Pt also wants to know if they should take their pain pills at this time?

## 2024-04-17 NOTE — Telephone Encounter (Signed)
 DOS: 12/12/23 L4-L5 decompression   DOS: 10/17/23 ACDF C3-C6 for cervical myelopathy   Any new weakness? Any new bowel/bladder issues? Has she taking any medications?   What does she have to take?

## 2024-04-17 NOTE — Telephone Encounter (Signed)
 If pain is severe, I would recommend she be seen by UC or in the ED. You can schedule her a f/u with PA but not sure how far out that will be.  We can also get cervical/lumbar xrays. Let me know if I need to order these.   I would restart the tizandine  to see if it helps.

## 2024-04-18 NOTE — Telephone Encounter (Signed)
 Patient advised.

## 2024-04-28 DIAGNOSIS — K219 Gastro-esophageal reflux disease without esophagitis: Secondary | ICD-10-CM | POA: Diagnosis not present

## 2024-04-28 DIAGNOSIS — E669 Obesity, unspecified: Secondary | ICD-10-CM | POA: Diagnosis not present

## 2024-04-28 DIAGNOSIS — R0789 Other chest pain: Secondary | ICD-10-CM | POA: Diagnosis not present

## 2024-04-28 DIAGNOSIS — R600 Localized edema: Secondary | ICD-10-CM | POA: Diagnosis not present

## 2024-04-28 DIAGNOSIS — Z6839 Body mass index (BMI) 39.0-39.9, adult: Secondary | ICD-10-CM | POA: Diagnosis not present

## 2024-04-28 DIAGNOSIS — E66812 Obesity, class 2: Secondary | ICD-10-CM | POA: Diagnosis not present

## 2024-04-28 DIAGNOSIS — I517 Cardiomegaly: Secondary | ICD-10-CM | POA: Diagnosis not present

## 2024-04-28 DIAGNOSIS — G479 Sleep disorder, unspecified: Secondary | ICD-10-CM | POA: Diagnosis not present

## 2024-06-05 ENCOUNTER — Other Ambulatory Visit: Payer: Self-pay | Admitting: Nurse Practitioner

## 2024-06-05 DIAGNOSIS — Z1231 Encounter for screening mammogram for malignant neoplasm of breast: Secondary | ICD-10-CM

## 2024-06-27 ENCOUNTER — Encounter

## 2024-07-04 ENCOUNTER — Other Ambulatory Visit: Payer: Self-pay

## 2024-07-04 DIAGNOSIS — G959 Disease of spinal cord, unspecified: Secondary | ICD-10-CM

## 2024-07-08 ENCOUNTER — Other Ambulatory Visit

## 2024-07-08 ENCOUNTER — Ambulatory Visit: Admitting: Neurosurgery

## 2024-07-10 ENCOUNTER — Encounter
